# Patient Record
Sex: Male | Born: 1980 | Hispanic: Yes | Marital: Single | Smoking: Current some day smoker
Health system: Southern US, Community
[De-identification: ages and names within clinical notes are randomized; demographics above are authoritative.]

## PROBLEM LIST (undated history)

## (undated) DIAGNOSIS — I10 Essential (primary) hypertension: Secondary | ICD-10-CM

## (undated) DIAGNOSIS — E669 Obesity, unspecified: Secondary | ICD-10-CM

## (undated) HISTORY — PX: KNEE SURGERY: SHX244

## (undated) HISTORY — PX: APPENDECTOMY: SHX54

## (undated) HISTORY — PX: ANKLE SURGERY: SHX546

## (undated) HISTORY — PX: NOSE SURGERY: SHX723

---

## 2008-01-06 ENCOUNTER — Inpatient Hospital Stay (HOSPITAL_COMMUNITY): Admission: EM | Admit: 2008-01-06 | Discharge: 2008-01-08 | Payer: Self-pay | Admitting: Emergency Medicine

## 2009-06-06 ENCOUNTER — Inpatient Hospital Stay (HOSPITAL_COMMUNITY): Admission: EM | Admit: 2009-06-06 | Discharge: 2009-06-07 | Payer: Self-pay | Admitting: Emergency Medicine

## 2009-06-06 ENCOUNTER — Encounter (INDEPENDENT_AMBULATORY_CARE_PROVIDER_SITE_OTHER): Payer: Self-pay | Admitting: Surgery

## 2010-10-27 ENCOUNTER — Emergency Department (HOSPITAL_COMMUNITY)
Admission: EM | Admit: 2010-10-27 | Discharge: 2010-10-28 | Payer: Self-pay | Source: Home / Self Care | Admitting: Emergency Medicine

## 2011-02-15 LAB — COMPREHENSIVE METABOLIC PANEL
AST: 19 U/L (ref 0–37)
Albumin: 4.1 g/dL (ref 3.5–5.2)
Alkaline Phosphatase: 136 U/L — ABNORMAL HIGH (ref 39–117)
CO2: 25 mEq/L (ref 19–32)
Chloride: 106 mEq/L (ref 96–112)
GFR calc Af Amer: >60 mL/min (ref >60)
GFR calc non Af Amer: >60 mL/min (ref >60)
Total Bilirubin: 0.7 mg/dL (ref 0.3–1.2)

## 2011-02-15 LAB — URINALYSIS, ROUTINE W REFLEX MICROSCOPIC
Bilirubin Urine: NEGATIVE
Hgb urine dipstick: NEGATIVE
Nitrite: NEGATIVE
Specific Gravity, Urine: 1.026 (ref 1.005–1.030)
pH: 6.5 (ref 5.0–8.0)

## 2011-02-15 LAB — CBC
HCT: 43.7 % (ref 39.0–52.0)
MCHC: 33.2 g/dL (ref 30.0–36.0)
MCV: 90.1 fL (ref 78.0–100.0)
Platelets: 364 10*3/uL (ref 150–400)
RDW: 12.2 % (ref 11.5–15.5)
WBC: 21 10*3/uL — ABNORMAL HIGH (ref 4.0–10.5)

## 2011-02-15 LAB — DIFFERENTIAL
Basophils Absolute: 0.2 10*3/uL — ABNORMAL HIGH (ref 0.0–0.1)
Basophils Relative: 1 % (ref 0–1)
Monocytes Relative: 4 % (ref 3–12)

## 2011-03-24 NOTE — H&P (Signed)
NAME:  [name], [name] NO.:  [id number]   MEDICAL RECORD NO.:  [id number]          PATIENT TYPE:  EMS   LOCATION:  ED                           FACILITY:  [facility]   PHYSICIAN:  Velora Heckler, MD      DATE OF BIRTH:  [birth year]   DATE OF ADMISSION:  06/06/2009  DATE OF DISCHARGE:                              HISTORY & PHYSICAL   REFERRING PHYSICIAN:  Dr. Linwood Dibbles, Emergency Department, [facility].   CHIEF COMPLAINT:  Abdominal pain, acute appendicitis.   HISTORY OF PRESENT ILLNESS:  The patient is a 30 year old Hispanic male  who presents to the emergency department with less than 24-hour history  of abdominal pain radiating to the right lower quadrant.  The patient  was seen and evaluated in the emergency room.  White blood cell count  was elevated at 21,000.  CT scan abdomen and pelvis was obtained, and  demonstrated findings consistent with acute appendicitis with  appendicolith.  General surgery is called for management.   PAST MEDICAL HISTORY:  Unremarkable.   MEDICATIONS:  None.   ALLERGIES:  No known drug allergies.   SOCIAL HISTORY:  The patient is married.  He is accompanied by his wife  who acts as a  Nurse, learning disability.  The patient speaks limited [place].  They  have 7 children.  He does smoke a pack of cigarettes a day.  He drinks  alcohol frequently.  He denies illicit drug use.   FAMILY HISTORY:  Noncontributory.   A 15-system review discussed with the patient's wife and the patient  with no other significant findings except as noted above.   PHYSICAL EXAMINATION:  A 30 year old well-developed, well-nourished  Hispanic male in no acute distress.  Temp 98.1, pulse 69, respirations 18, blood pressure 150/111.  HEENT:  Shows him to be normocephalic, atraumatic.  Sclerae are clear.  Conjunctiva are clear.  Dentition fair.  Mucous membranes dry.  Palpation of the neck shows no lymphadenopathy.  No thyroid nodularity.  No mass.  No  tenderness.  LUNGS:  Clear to auscultation bilaterally without rales, rhonchi or  wheeze.  CARDIAC:  Shows regular rate and rhythm without murmur.  Peripheral pulses are full.  EXTREMITIES:  Nontender without edema.  ABDOMEN:  Soft.  Bowel sounds are present.  There is mild to moderate  tenderness in the right lower quadrant.  There is voluntary guarding.  There is no palpable mass.  There is rebound tenderness.  EXTREMITIES:  Nontender without edema.  NEUROLOGICAL:  The patient is alert and oriented without focal deficit.   DATA REVIEWED:  Laboratory studies show white count 21.0, hemoglobin  14.5, platelet count 364,000, differential shows 90% segmented  neutrophils.  Electrolytes are normal.  Liver function tests are normal.  Urinalysis is benign.   RADIOGRAPHIC STUDIES:  CT scan abdomen and pelvis demonstrating an  appendicolith at the base of the appendix with a swollen dilated  appendix measuring 12 mm and surrounding inflammatory changes consistent  with acute appendicitis.   IMPRESSION:  Acute appendicitis.   PLAN:  The patient will be admitted on  the general surgical service.  Intravenous antibiotics will be initiated.  The patient will be prepared  and taken to the operating room for appendectomy.   The operative procedure was discussed with the patient using his wife as  the interpreter.  He understands the proposed procedure.  He understands  the possibility of conversion to open surgery.  He wishes to proceed.      Velora Heckler, MD  Electronically Signed     TMG/MEDQ  D:  06/06/2009  T:  06/07/2009  Job:  [id number]   cc:   Celene Kras, MD  Fax: [phone]

## 2011-03-24 NOTE — Op Note (Signed)
[name], [name]               ACCOUNT NO.:  [id number]   MEDICAL RECORD NO.:  [id number]          PATIENT TYPE:  INP   LOCATION:  5011                         FACILITY:  [facility]   PHYSICIAN:  Zola Button T. Lazarus Salines, M.D. DATE OF BIRTH:  05/16/1981   DATE OF PROCEDURE:  01/06/2008  DATE OF DISCHARGE:                               OPERATIVE REPORT   PREOPERATIVE DIAGNOSES:  1. Nasal and nasoseptal fracture.  2. Lacerations of the upper lip, of the oral vestibule, and of the      nasal vestibule on the left side.   POSTOPERATIVE DIAGNOSES:  1. Nasal and nasoseptal fracture.  2. Degloving laceration of the oral vestibule and of the nasal      vestibule bilaterally.  3. Comminuted nasal fracture with depression.  4. Leftward septal deviation and fracture with laceration along the      maxillary crest on the left side.  5. Partial-thickness laceration in the midline of the vermilion of the      lip.  6. Laceration extending out to the philtrum and up the columella of      the nose on the left side.   SURGEON:  Gloris Manchester. Lazarus Salines, M.D.   PROCEDURE:  With the patient in the comfortable supine position, general  orotracheal anesthesia was induced without difficulty.  At an  appropriate level, the table was turned 90 degrees and the patient  placed in a slight sitting position.  Dr. Luiz Blare, Orthopedics, and Dr.  Lazarus Salines, ENT, worked simultaneously in their respective areas.   The pharynx was suctioned clean and a saline-moistened throat pack was  placed.  The nose was suctioned from old clots and Afrin solution on 1/2  x 3 inches cottonoids was placed into the nasal chamber on both sides.  Further inspection revealed significant degloving injuries along the  face of the maxilla on both sides and down below the nasal spine into  the upper lip and oral vestibule through and through, additional  laceration of the vermilion of the lower lip and circumferentially  around the left nasal  vestibule and down onto the philtrum of the upper  lip.  The wounds were carefully irrigated and suctioned clear.   Beginning with the upper lip, the midline laceration was closed with  deep and then superficial 4-0 chromic sutures.   Working internally, the mucosa of the gingiva was reapproximated to  close the oral vestibular wound with 3-0 and 4-0 chromic sutures.   Working through the upper lip laceration, deep soft tissue  reapproximation was performed with interrupted 4-0 chromic sutures and  finally the skin was closed in a cosmetic fashion with interrupted 5-0  Ethilon sutures up onto the columella.   The cottonoids were removed from the left side of the nose.  Working  through the left nasal vestibule, a small amount of cautery was used for  hemostasis along the mucosal edges.  The mucosa was lacerated up into  the dome of the nose and then more than 270 degrees around the nasal  vestibule and then up onto the columella from below.  A 4-0  chromic was  used to reapproximate the mucosa overlying the lower lateral cartilages  in the nasal dome and this was carried around clockwise.  A 3-0 chromic  was used to reapproximate the heavier mucosa of the anterior pole of the  inferior turbinate to the mucosa of the nasal vestibule.  A good  reconstruction was accomplished.  Hemostasis was observed.   Working in the right nasal vestibule, a lesser degloving injury was  closed once again with 3-0 chromic suture.   At this point, all cottonoids had been removed from the nose.  The nose  was carefully suctioned clear.  A butter knife fracture elevator was  used to raise the dorsum of the nose, which was comminuted, and easily  reduced into the midline.  An Ash forceps was used to mobilize the nasal  septum into the midline.  Hemostasis was observed.   Merocel packs were shortened to approximately 8-cm length and were  placed in both sides of the nose inferiorly to support the septum.   These were expanded and moistened with Ciprodex otic solution.  Again,  hemostasis was observed.   The external nose was cleaned with alcohol and painted with skin prep.  Steri-Strips were applied in the standard fashion.  A small/medium  Denver splint was fashioned and then applied to the nose and compressed  slightly for support.  Again, hemostasis was observed.  This completed  the procedure.   The pharynx was carefully suctioned clear and the throat pack was  removed.  The strings from the Merocel packs were secured to the  external nasal splint.  A drip pad was applied in the standard fashion.  A small amount of bacitracin ointment was applied into the nostrils and  along the wounds of the lip.  At this point, the patient was returned to  Anesthesia, awakened, extubated, and transferred to Recovery in stable  condition.   COMMENT:  [age]-year-old Hispanic male fell off a ladder earlier  today, sustaining a left patellar tendon injury which was repaired by  Dr. Luiz Blare, and complex degloving lacerations of the nose and upper lip  as well as nasal and nasoseptal fractures.  I anticipate a routine  postoperative recovery with attention to ice, elevation, analgesia,  antibiosis, oral and nasal, and wound hygiene measures.  The patient  could be done as an outpatient for my standpoint.  I would like him to  stay on Keflex for least 1 week.  I would like to see him back in my  office in 5 days for removal of the internal Merocel packs and 10 days  for removal of the external [place] splint.      Gloris Manchester. Lazarus Salines, M.D.  Electronically Signed     KTW/MEDQ  D:  01/06/2008  T:  01/08/2008  Job:  [id number]   cc:   Harvie Junior, M.D.

## 2011-03-24 NOTE — Op Note (Signed)
[name], [name]               ACCOUNT NO.:  [id number]   MEDICAL RECORD NO.:  [id number]          PATIENT TYPE:  INP   LOCATION:  1826                         FACILITY:  [facility]   PHYSICIAN:  Harvie Junior, M.D.   DATE OF BIRTH:  05/16/1981   DATE OF PROCEDURE:  01/06/2008  DATE OF DISCHARGE:                               OPERATIVE REPORT   PREOPERATIVE DIAGNOSES:  1. Fractured patella with small inferior pole fragment.  2. Fourth metatarsophalangeal dislocation of the foot.   POSTOPERATIVE DIAGNOSES:  1. Fractured patella with small inferior pole fragment.  2. Fourth metatarsophalangeal dislocation of the foot.   PRINCIPAL PROCEDURE:  1. Patellar tendon repair with excision of inferior pole fragment.  2. Arthrotomy with irrigation and debridement thorough of the joint.  3. Closed reduction of metatarsophalangeal joint fourth, left.   SURGEON:  Harvie Junior, M.D.   ASSISTANT:  Marshia Ly, P.A.   ANESTHESIA:  General.   HISTORY:  [name] is a 30 year old male with an unfortunate history  of being up on a ladder and having the ladder kind of slip away from  him.  He suffered severe injuries to his face and had the patellar  fracture left and the toe dislocation.  He also had bumped his elbow and  he was complaining of these things.  Ultimately we were consulted in the  emergency room and through an interpreter we discussed the situation  with him and his friends who spoke actually both [place] and [place].  We got a fluent Spanish interpreter through the phone system.  We talked  to him and he seemed to understand very clearly what the problems were.  Ultimately he was taken to the operating room for fixation of these  problems.   PROCEDURE IN DETAIL:  The patient was taken to the operating room.  Once  adequate anesthesia was obtained with general anesthetic the patient was  placed on the operating table.  The left leg was prepped and draped in  the usual  sterile fashion.  Following this the left leg was  exsanguinated.  The pressure was set at [facility].  Following this a  cephalad incision was made transversely over the extensor mechanism and  the obvious fracture and patellar tendon rupture was identified.  The  fracture fragments were then excised piecemeal and once they were all  removed from the joint which was then thoroughly irrigated and loose  fragments of patellar bone were removed from the joint.  It was  copiously irrigated with a liter of pulsatile irrigation.  At this point  a #5 FiberWire centrally was sewn in a Halesite fashion, #2 FiberWire  medially and laterally yielding 3 sutures through the patellar tendon  were passed and then 4 drill holes were made through the patella and  these FiberWire sutures were passed.  The 2 lateral and medial sutures  were held in a reduced position and a central #5 FiberWire was tied and  then the 2 lateral and medial FiberWires were tied.  Medial and lateral  retinaculum were then repaired with 1 Vicryl running  and this gave an  excellent __________ at 45 degrees, was reamed up to about 65-70 without  any tension on the repair.  Once that was completed the bursal and  subcuticular levels were closed and we got actually pretty nice bursal  layer closure over the FiberWire and then the skin was closed with 0 and  2-0 Vicryl and skin with skin staples.  Sterile compressive dressing was  applied.   Attention was then turned to the foot where the metatarsophalangeal  dislocation of the fourth toe was identified and this was popped back  into place with very stable fat pad in place.  The patient was still  undergoing facial surgery and will ultimately be taken to the recovery  room when that is completed.      Harvie Junior, M.D.  Electronically Signed     JLG/MEDQ  D:  01/06/2008  T:  01/08/2008  Job:  [id number]   cc:   Zola Button T. Lazarus Salines, M.D.  Marshia Ly, P.A.

## 2011-03-24 NOTE — Op Note (Signed)
[name], [name]               ACCOUNT NO.:  [id number]   MEDICAL RECORD NO.:  [id number]          PATIENT TYPE:  INP   LOCATION:  0098                         FACILITY:  [facility]   PHYSICIAN:  Velora Heckler, MD      DATE OF BIRTH:  21-Oct-1981   DATE OF PROCEDURE:  06/07/2009  DATE OF DISCHARGE:                               OPERATIVE REPORT   PREOPERATIVE DIAGNOSIS:  Acute appendicitis.   POSTOPERATIVE DIAGNOSIS:  Acute appendicitis.   PROCEDURE:  Laparoscopic appendectomy.   SURGEON:  Velora Heckler, MD, FACS   ANESTHESIA:  General per Quentin Cornwall. Council Mechanic, M.D.   ESTIMATED BLOOD LOSS:  Minimal.   PREPARATION:  ChloraPrep.   COMPLICATIONS:  None.   INDICATIONS:  The patient is a 30 year old Hispanic male who presents to  the emergency department with less than 24 hour history of abdominal  pain localized to the right lower quadrant.  Laboratory studies showed  elevated white blood cell count of 21,000 with a left shift.  CT scan  abdomen and pelvis confirms a diagnosis of acute appendicitis.  The  patient is prepared brought to the operating room.   BODY OF REPORT:  Procedure is done in OR #1 at [facility].  The patient is brought to the operating room, placed in  supine position on the operating room table.  Following administration  of general anesthesia, the patient is prepped and draped in usual strict  aseptic fashion.  After ascertaining that an adequate level of  anesthesia been achieved, a supraumbilical incision was made a #15  blade.  Dissection was carried through subcutaneous tissues.  Fascia is  incised in the midline and the peritoneal cavity is entered cautiously.  A 0-0 Vicryl pursestring suture was placed in the fascia.  A Hasson  cannula is introduced under direct vision and secured with a pursestring  suture.  Abdomen was insufflated carbon dioxide.  Laparoscope was  introduced and the abdomen explored.  Operative ports were placed in  the  right upper quadrant and left lower quadrant.  Omentum is mobilized out  of the right lower quadrant.  There is a tense, distended appendix  consistent with acute appendicitis.  There is no sign of perforation.  There is a small amount of cloudy fluid immediately adjacent to the  appendix.  Appendix was elevated and using the harmonic scalpel, the  mesoappendix was divided.  Dissection was carried down to the base of  the appendix.  Base of the appendix is cleared circumferentially.  The  lateral cecal attachments to the peritoneum are divided allowing for  mobilization.  The base of the appendix was then transected at its  junction with the cecal wall using an Endo-GIA stapler.  Appendix was  placed into an EndoCatch bag and removed through the umbilical port.  Staple line shows good hemostasis.  Pneumoperitoneum was released and  ports were removed.  0-0 Vicryl pursestring suture is tied securely.  Port sites were anesthetized with local anesthetic.  All three wounds  were closed  with interrupted 4-0 Monocryl subcuticular sutures.  Wounds  washed and  dried and Benzoin and Steri-Strips are applied.  Sterile dressings were  applied.  The patient is awakened from anesthesia and brought to the  recovery room in stable condition.  The patient tolerated the procedure  well.      Velora Heckler, MD  Electronically Signed     TMG/MEDQ  D:  06/07/2009  T:  06/07/2009  Job:  [id number]   cc:   Celene Kras, MD  Fax: [phone]

## 2011-03-24 NOTE — Consult Note (Signed)
NAMEZACHARIE, PORTNER               ACCOUNT NO.:  [id number]   MEDICAL RECORD NO.:  [id number]          PATIENT TYPE:  INP   LOCATION:  5011                         FACILITY:  [facility]   PHYSICIAN:  Zola Button T. Lazarus Salines, M.D. DATE OF BIRTH:  05/16/1981   DATE OF CONSULTATION:  01/06/2008  DATE OF DISCHARGE:                                 CONSULTATION   CHIEF COMPLAINT:  Facial trauma.   HISTORY:  A. 30 year old Hispanic male reportedly fell off a ladder  earlier this afternoon from a height of approximately 20 feet head to  ground.  It is not clear exactly how he landed, but suspicion of landing  either on the rungs of the latter or on his face.  He did not lose  consciousness.  He had bleeding from the nose which has stopped.  Evaluation in the emergency room revealed a comminuted nasal fracture,  displaced nasal septal fracture, question of some telescoping of the  nasal ethmoid complex.  ENT was called in consultation for assistance  with this.  He also sustained a patellar tendon laceration/avulsion  which Dr. Luiz Blare is going to tend to.   PHYSICAL EXAMINATION:  This is a somewhat stocky and overweight young  adult Hispanic male.  He seems to respond appropriately through an  interpreter.  He has swelling of the midface, blood filling both  nostrils and partial swelling of the left upper eyelid.  He seems to  have subjectively intact vision in each eye with full range of motion.  The ears are clear with aerated drums.  I could not examine the internal  nose on account of blood.  There was some depression at the glabella,  which his friend thinks may be old.  There is some flattening of the  dorsum and definitely some edema consistent with the injury.  There are  some lacerations in the left nasal vestibule and, on inspection, on the  lip and up into the upper oral vestibule.  Dental status seems basically  intact.  There is a small amount of old blood in the pharynx.  Neck  unremarkable.   IMPRESSION:  Depressed nasal fracture.  Displaced nasal septal fracture.  Left nasal vestibular lacerations, lip lacerations, and a degloving  laceration of the upper oral vestibule.   PLAN:  I would like to join Dr. Luiz Blare in the OR this evening, to  thoroughly clean out his nose, irrigate and clean all of his wounds,  close the various lacerations and perform closed reduction of the nasal  septum and nasal dorsum.  I discussed this with the patient through the  interpreter.  We will place pack in his nose which we should remove in  five days in my office.  We will have an external nasal splint which  should remain in place for 10 days.  He should be on cephalexin or  similar antibiotics for 10 days.      Gloris Manchester. Lazarus Salines, M.D.  Electronically Signed     KTW/MEDQ  D:  01/06/2008  T:  01/08/2008  Job:  [id number]

## 2011-03-27 NOTE — Discharge Summary (Signed)
NAMESHYLER, [name]               ACCOUNT NO.:  [id number]   MEDICAL RECORD NO.:  [id number]          PATIENT TYPE:  INP   LOCATION:  5011                         FACILITY:  [facility]   PHYSICIAN:  Harvie Junior, M.D.   DATE OF BIRTH:  05/16/1981   DATE OF ADMISSION:  01/06/2008  DATE OF DISCHARGE:  01/08/2008                               DISCHARGE SUMMARY   ADMITTING DIAGNOSES:  1. Avulsion fracture left inferior pole of patella with patellar      tendon rupture.  2. Dislocation left fourth toe.  3. Depressed nasal fracture with displaced nasal septal fracture and      left nasal vestibular lacerations with degloving laceration to the      upper oral vestibule.   DISCHARGE DIAGNOSES:  1. Avulsion fracture left inferior pole of patella with patellar      tendon rupture.  2. Dislocation left fourth toe.  3. Depressed nasal fracture with displaced nasal septal fracture and      left nasal vestibular lacerations with degloving laceration to the      upper oral vestibule.   PROCEDURES IN HOSPITAL:  1. Repair of left patellar tendon with excision of patellar bone      fragments, Dr. Jodi Geralds.  2. Closed reduction of the left fourth toe, Dr. Jodi Geralds  3. Closure of the lip and nose degloving lacerations.  4. Closed reduction with stabilization of nasal and nasal septal      lacerations, Dr. Lazarus Salines.   BRIEF HISTORY:  This is a 30 year old male who fell off a ladder early  on the day of admission.  He could not lift his left leg.  He also had a  significant facial injury which was treated by Dr. Lazarus Salines, ear, nose,  and throat specialist.  The patient has no history of previous injury to  his leg.  He was at work when this occurred.  He is admitted for  treatment of his left knee.  He had an inferior total avulsion fracture  with rupture of his patella tendon.  He also had multiple facial  lacerations secondary to his facial trauma, and he was treated by Dr.  Lazarus Salines.   PERTINENT LABORATORY STUDIES:  EKG on admission showed sinus rhythm with  marked sinus arrhythmia and has normal EKG.  X-rays of the chest showed  no active lung disease.  X-rays of the left knee showed an avulsion  fracture of the inferior pole of the patella with the patella retracted  cephalad.  Left foot x-rays showed recurrent dislocation of the left  foot MTP joint.  Left elbow x-rays were negative.  CT scan of the head  showed no acute intracranial abnormality with multiple facial fractures.  A comminuted nasal bone fracture with a fracture of the nasal septum  which was displaced posteriorly and there were some fractures of the  ethmoid sinuses.  There was no orbital fracture.  CT scan of the  cervical spine showed no evidence of cervical spine fracture or  subluxation.  Hemoglobin on admission was 15.1, hematocrit 44.0, and WBC  was 25.0.  indices were within normal limits.  CMET was normal and  slightly elevated ALP of 131.   HOSPITAL COURSE:  The patient was admitted to the emergency room and  evaluated by Dr. Luiz Blare for his orthopedic injuries and brought back to  Dr. Lazarus Salines for his ENT injuries.  He was brought to the operating room  where he underwent surgery as described.  Preoperatively, he was given a  gram of Ancef, and postoperatively, he was placed in a knee immobilizer  to his left knee.  Dressings were placed by Dr. Lazarus Salines for his nasal  injuries and lacerations.  On postop day 1, he had complaints of some  knee pain.  He was taking fluids.  He had gotten out of bed.  He had  vital signs that were stable, and he is afebrile.  Knee immobilizers  intact to the left knee, area is intact distally, as well fourth toe in  good position.  He was ordered physical therapy for crutch ambulation,  weightbearing as tolerated on the left.  He was given Percocet for pain  and also given Ancef 1 g IV q.8 h. x24 hours.  On postop day 2, the  patient was ambulating with physical  therapy and doing well.  He was  slow to mobilize, but overall felt good.  His vital signs were stable.  He is afebrile.  Left lower extremity was supple, and his calf was soft.  He has gotten out of bed with physical therapy.  Again, he was sent home  after physical therapy.   CONDITION ON DISCHARGE:  Improved.   DIET ON DISCHARGE:  Regular.   ACTIVITY STATUS:  Will ambulate and weightbearing as tolerated with  crutches on the left with a knee immobilizer.   He was given RX for Percocet 5 mg p.r.n. pain and Keflex 500 mg t.i.d.  x5 days.  He will follow up with Dr. Luiz Blare in 2 weeks in the office,  with Dr. Lazarus Salines in 5 days.      Marshia Ly, P.A.      Harvie Junior, M.D.  Electronically Signed    JB/MEDQ  D:  06/20/2008  T:  06/21/2008  Job:  [id number]   cc:   Harvie Junior, M.D.  Gloris [place]. Lazarus Salines, M.D.

## 2011-05-14 ENCOUNTER — Emergency Department (HOSPITAL_COMMUNITY)
Admission: EM | Admit: 2011-05-14 | Discharge: 2011-05-15 | Disposition: A | Discharge status: Home / Self Care | Payer: Self-pay | Attending: Emergency Medicine | Admitting: Emergency Medicine

## 2011-05-14 DIAGNOSIS — L0231 Cutaneous abscess of buttock: Secondary | ICD-10-CM | POA: Insufficient documentation

## 2011-05-14 DIAGNOSIS — L03317 Cellulitis of buttock: Secondary | ICD-10-CM | POA: Insufficient documentation

## 2011-05-17 ENCOUNTER — Emergency Department (HOSPITAL_COMMUNITY)
Admission: EM | Admit: 2011-05-17 | Discharge: 2011-05-17 | Disposition: A | Discharge status: Home / Self Care | Payer: Self-pay | Attending: Emergency Medicine | Admitting: Emergency Medicine

## 2011-05-17 DIAGNOSIS — L0231 Cutaneous abscess of buttock: Secondary | ICD-10-CM | POA: Insufficient documentation

## 2011-05-17 DIAGNOSIS — Z4801 Encounter for change or removal of surgical wound dressing: Secondary | ICD-10-CM | POA: Insufficient documentation

## 2011-05-17 DIAGNOSIS — L03317 Cellulitis of buttock: Secondary | ICD-10-CM | POA: Insufficient documentation

## 2011-05-29 ENCOUNTER — Emergency Department (HOSPITAL_COMMUNITY)
Admission: EM | Admit: 2011-05-29 | Discharge: 2011-05-30 | Disposition: A | Discharge status: Home / Self Care | Payer: Self-pay | Attending: Emergency Medicine | Admitting: Emergency Medicine

## 2011-05-29 DIAGNOSIS — L500 Allergic urticaria: Secondary | ICD-10-CM | POA: Insufficient documentation

## 2011-05-29 DIAGNOSIS — L2989 Other pruritus: Secondary | ICD-10-CM | POA: Insufficient documentation

## 2011-05-29 DIAGNOSIS — R21 Rash and other nonspecific skin eruption: Secondary | ICD-10-CM | POA: Insufficient documentation

## 2011-05-29 DIAGNOSIS — R229 Localized swelling, mass and lump, unspecified: Secondary | ICD-10-CM | POA: Insufficient documentation

## 2011-05-29 DIAGNOSIS — L298 Other pruritus: Secondary | ICD-10-CM | POA: Insufficient documentation

## 2011-07-31 LAB — DIFFERENTIAL
Basophils Absolute: 0.1
Basophils Relative: 0
Eosinophils Absolute: 0
Lymphocytes Relative: 8 — ABNORMAL LOW
Monocytes Relative: 5

## 2011-07-31 LAB — PROTIME-INR: Prothrombin Time: 14

## 2011-07-31 LAB — CBC
Platelets: 447 — ABNORMAL HIGH
RBC: 5.04
RDW: 12.7

## 2011-07-31 LAB — COMPREHENSIVE METABOLIC PANEL
ALT: 39
Albumin: 4.1
BUN: 13
CO2: 25
Calcium: 8.9
GFR calc non Af Amer: >60
Glucose, Bld: 101 — ABNORMAL HIGH
Sodium: 136
Total Bilirubin: 0.6

## 2011-10-03 ENCOUNTER — Emergency Department (HOSPITAL_COMMUNITY)
Admission: EM | Admit: 2011-10-03 | Discharge: 2011-10-03 | Disposition: A | Discharge status: Home / Self Care | Payer: Self-pay | Attending: Emergency Medicine | Admitting: Emergency Medicine

## 2011-10-03 ENCOUNTER — Encounter: Payer: Self-pay | Admitting: *Deleted

## 2011-10-03 DIAGNOSIS — H6690 Otitis media, unspecified, unspecified ear: Secondary | ICD-10-CM | POA: Insufficient documentation

## 2011-10-03 DIAGNOSIS — H9209 Otalgia, unspecified ear: Secondary | ICD-10-CM | POA: Insufficient documentation

## 2011-10-03 DIAGNOSIS — J029 Acute pharyngitis, unspecified: Secondary | ICD-10-CM | POA: Insufficient documentation

## 2011-10-03 MED ORDER — ANTIPYRINE-BENZOCAINE 5.4-1.4 % OT SOLN: 3.0000 [drp] | OTIC | Status: AC | PRN

## 2011-10-03 MED ORDER — ACETAMINOPHEN 325 MG PO TABS
650.0000 mg | ORAL_TABLET | Freq: Once | ORAL | Status: AC
  Administered 2011-10-03: 650 mg via ORAL
  Filled 2011-10-03: qty 2

## 2011-10-03 MED ORDER — PENICILLIN V POTASSIUM 500 MG PO TABS: 500.0000 mg | ORAL_TABLET | Freq: Three times a day (TID) | ORAL | Status: AC

## 2011-10-03 NOTE — ED Provider Notes (Signed)
History     CSN: [id number] Arrival date & time: 10/03/2011  1:03 PM   First MD Initiated Contact with Patient 10/03/11 1511      Chief Complaint  Patient presents with  . Otalgia    (Consider location/radiation/quality/duration/timing/severity/associated sxs/prior treatment) Patient is a 30 y.o. male presenting with ear pain. The history is provided by the patient.  Otalgia The current episode started more than 2 days ago. There is pain in the right ear. The problem occurs constantly. The problem has been gradually worsening. There has been no fever. The pain is at a severity of 5/10. The pain is moderate. Associated symptoms include sore throat.    History reviewed. No pertinent past medical history.  Past Surgical History  Procedure Date  . Knee surgery     No family history on file.  History  Substance Use Topics  . Smoking status: Current Some Day Smoker  . Smokeless tobacco: Not on file  . Alcohol Use: No      Review of Systems  HENT: Positive for ear pain and sore throat.   All other systems reviewed and are negative.    Allergies  Review of patient's allergies indicates no known allergies.  Home Medications   Current Outpatient Rx  Name Route Sig Dispense Refill  . SUDAFED PE NIGHTTIME COLD PO Oral Take 2 tablets by mouth daily as needed. For congestion.       BP 159/98  Pulse 91  Temp(Src) 97.7 F (36.5 C) (Oral)  Resp 18  SpO2 99%  Physical Exam  Constitutional: He is oriented to person, place, and time. He appears well-developed and well-nourished.  HENT:  Head: Normocephalic and atraumatic.  Left Ear: External ear normal.  Ears:  Eyes: EOM are normal. Pupils are equal, round, and reactive to light.  Neck: Normal range of motion.  Cardiovascular: Normal rate and regular rhythm.   Pulmonary/Chest: Effort normal and breath sounds normal.  Musculoskeletal: Normal range of motion.  Neurological: He is alert and oriented to person, place,  and time.  Skin: Skin is warm and dry.    ED Course  Procedures (including critical care time)  Labs Reviewed - No data to display No results found.   No diagnosis found.    MDM          Dorthula Matas, PA 10/03/11 [phone]

## 2011-10-03 NOTE — ED Notes (Signed)
bil ear pain, started 3 days ago

## 2011-10-04 NOTE — ED Provider Notes (Signed)
Medical screening examination/treatment/procedure(s) were performed by non-physician practitioner and as supervising physician I was immediately available for consultation/collaboration.   Joya Gaskins, MD 10/04/11 0001

## 2012-05-16 ENCOUNTER — Emergency Department (HOSPITAL_COMMUNITY)
Admission: EM | Admit: 2012-05-16 | Discharge: 2012-05-17 | Disposition: A | Discharge status: Home / Self Care | Payer: Self-pay | Attending: Emergency Medicine | Admitting: Emergency Medicine

## 2012-05-16 ENCOUNTER — Encounter (HOSPITAL_COMMUNITY): Payer: Self-pay | Admitting: *Deleted

## 2012-05-16 DIAGNOSIS — M79609 Pain in unspecified limb: Secondary | ICD-10-CM | POA: Insufficient documentation

## 2012-05-16 DIAGNOSIS — M545 Low back pain, unspecified: Secondary | ICD-10-CM | POA: Insufficient documentation

## 2012-05-16 DIAGNOSIS — F17200 Nicotine dependence, unspecified, uncomplicated: Secondary | ICD-10-CM | POA: Insufficient documentation

## 2012-05-16 DIAGNOSIS — M25559 Pain in unspecified hip: Secondary | ICD-10-CM | POA: Insufficient documentation

## 2012-05-16 DIAGNOSIS — M549 Dorsalgia, unspecified: Secondary | ICD-10-CM

## 2012-05-16 NOTE — ED Notes (Signed)
Pt c/o pain left flank/hip pain radiating down left leg when he raises his left arm; no known injury

## 2012-05-17 MED ORDER — METHOCARBAMOL 500 MG PO TABS: 1000.0000 mg | ORAL_TABLET | Freq: Four times a day (QID) | ORAL | Status: AC

## 2012-05-17 MED ORDER — HYDROCODONE-ACETAMINOPHEN 5-325 MG PO TABS: ORAL_TABLET | ORAL | Status: AC

## 2012-05-17 MED ORDER — OXYCODONE-ACETAMINOPHEN 5-325 MG PO TABS
1.0000 | ORAL_TABLET | Freq: Once | ORAL | Status: AC
  Administered 2012-05-17: 1 via ORAL
  Filled 2012-05-17: qty 1

## 2012-05-17 MED ORDER — NAPROXEN 500 MG PO TABS: 500.0000 mg | ORAL_TABLET | Freq: Two times a day (BID) | ORAL | Status: DC

## 2012-05-17 NOTE — ED Notes (Signed)
Rx given x3 Pt ambulating independently w/ steady gait on d/c in no acute distress, A&Ox4. D/c instructions reviewed w/ pt and family - pt and family deny any further questions or concerns at present.  

## 2012-05-17 NOTE — ED Provider Notes (Signed)
History     CSN: [id number]  Arrival date & time 05/16/12  2108   First MD Initiated Contact with Patient 05/16/12 2321      Chief Complaint  Patient presents with  . Hip Pain  . Leg Pain    (Consider location/radiation/quality/duration/timing/severity/associated sxs/prior treatment) HPI Comments: Patient presents with complaint of left lower back pain with a shooting pain into his left leg that began acutely this morning when he was standing up from his knees. 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 Walking and standing makes the pain worse. Sitting and resting makes the pain better. Onset was acute. Course is constant. Patient denies red flag signs and symptoms of lower back pain. He denies numbness, weakness, or tingling in his legs.  Patient is a 31 y.o. male presenting with back pain. The history is provided by the patient. A language interpreter was used.  Back Pain  This is a new problem. The current episode started 12 to 24 hours ago. The problem occurs constantly. The problem has not changed since onset.The pain is associated with twisting. The pain is present in the lumbar spine. The quality of the pain is described as shooting. The pain radiates to the left thigh and left knee. The pain is moderate. The symptoms are aggravated by bending and twisting (walking). Associated symptoms include leg pain. Pertinent negatives include no fever, no numbness, no weight loss, no bowel incontinence, no perianal numbness, no bladder incontinence, no paresthesias and no weakness. He has tried nothing for the symptoms. Risk factors include obesity.    History reviewed. No pertinent past medical history.  Past Surgical History  Procedure Date  . Knee surgery     No family history on file.  History  Substance Use Topics  . Smoking status: Current Some Day Smoker  . Smokeless tobacco: Not on file  . Alcohol Use:  No      Review of Systems  Constitutional: Negative for fever, weight loss and unexpected weight change.  Gastrointestinal: Negative for constipation and bowel incontinence.       Neg for fecal incontinence  Genitourinary: Negative for bladder incontinence, hematuria, flank pain and difficulty urinating.       Negative for urinary incontinence or retention  Musculoskeletal: Positive for back pain.  Neurological: Negative for weakness, numbness and paresthesias.       Negative for saddle paresthesias     Allergies  Review of patient's allergies indicates no known allergies.  Home Medications   Current Outpatient Rx  Name Route Sig Dispense Refill  . SUDAFED PE NIGHTTIME COLD PO Oral Take 2 tablets by mouth daily as needed. For congestion.       BP 141/83  Pulse 99  Temp 98.7 F (37.1 C) (Oral)  Resp 18  Ht 5\' 9"  (1.753 m)  Wt 294 lb (133.358 kg)  BMI 43.42 kg/m2  SpO2 100%  Physical Exam  Nursing note and vitals reviewed. Constitutional: He appears well-developed and well-nourished.  HENT:  Head: Normocephalic and atraumatic.  Eyes: Conjunctivae are normal.  Neck: Normal range of motion.  Abdominal: Soft. There is no tenderness. There is no CVA tenderness.  Musculoskeletal: Normal range of motion. He exhibits no tenderness.       There is no tenderness to palpation over cervical/thoracic/lumbar/sacral spine. Tenderness to palpation over L lumbar paraspinal muscles. No step-off noted with palpation of spine.  Neurological: He is alert. He has normal reflexes. No sensory deficit. He exhibits normal muscle tone.       5/5 strength in entire lower extremities bilaterally. No sensation deficit.   Skin: Skin is warm and dry.  Psychiatric: He has a normal mood and affect.    ED Course  Procedures (including critical care time)  Labs Reviewed - No data to display No results found.   1. Back pain     12:13 AM Patient seen and examined. Medications ordered.    Vital signs reviewed and are as follows: Filed Vitals:   05/16/12 2129  BP: 141/83  Pulse: 99  Temp: 98.7 F (37.1 C)  Resp: 18   Patient was seen and examined. No red flag s/s of low back pain. Patient was counseled on back pain precautions and told to do activity as tolerated but do not lift, push, or pull heavy objects more than 10 pounds for the next week.  Patient counseled to use ice or heat on back for no longer than 15 minutes every hour.   Patient prescribed muscle relaxer and counseled on proper use of muscle relaxant medication.    Patient prescribed narcotic pain medicine and counseled on proper use of narcotic pain medications. Counseled not to combine this medication with others containing tylenol.   Urged patient not to drink alcohol, drive, or perform any other activities that requires focus while taking either of these medications.  Patient urged to follow-up with PCP if pain does not improve with treatment and rest or if pain becomes recurrent. Urged to return with worsening severe pain, loss of bowel or bladder control, trouble walking.   The patient verbalizes understanding and agrees with the plan.  MDM  Patient with back pain, sciatica sx. No neurological deficits. Patient is ambulatory. No warning symptoms of back pain including: loss of bowel or bladder control, night sweats, waking from sleep with back pain, unexplained fevers or weight loss, h/o cancer, IVDU, recent trauma. No concern for cauda equina, epidural abscess, or other serious cause of back pain. Conservative measures such as rest, ice/heat and pain medicine indicated with PCP follow-up if no improvement with conservative management.          [place], [place] 05/19/12 [phone]

## 2012-05-17 NOTE — ED Notes (Signed)
Pt's family states pt began having acute onset of lower back pain having twisted his torso yesterday approx 0900. Pt reports increased pain w/ movement and ambulation.

## 2012-05-20 ENCOUNTER — Encounter (HOSPITAL_COMMUNITY): Payer: Self-pay | Admitting: Emergency Medicine

## 2012-05-20 ENCOUNTER — Emergency Department (HOSPITAL_COMMUNITY)
Admission: EM | Admit: 2012-05-20 | Discharge: 2012-05-20 | Disposition: A | Discharge status: Home / Self Care | Payer: Self-pay | Attending: Emergency Medicine | Admitting: Emergency Medicine

## 2012-05-20 DIAGNOSIS — M545 Low back pain, unspecified: Secondary | ICD-10-CM | POA: Insufficient documentation

## 2012-05-20 DIAGNOSIS — F17200 Nicotine dependence, unspecified, uncomplicated: Secondary | ICD-10-CM | POA: Insufficient documentation

## 2012-05-20 DIAGNOSIS — M5432 Sciatica, left side: Secondary | ICD-10-CM

## 2012-05-20 DIAGNOSIS — M5430 Sciatica, unspecified side: Secondary | ICD-10-CM | POA: Insufficient documentation

## 2012-05-20 MED ORDER — OXYCODONE-ACETAMINOPHEN 5-325 MG PO TABS
2.0000 | ORAL_TABLET | Freq: Once | ORAL | Status: AC
  Administered 2012-05-20: 2 via ORAL
  Filled 2012-05-20: qty 2

## 2012-05-20 MED ORDER — PREDNISONE 20 MG PO TABS: ORAL_TABLET | ORAL | Status: AC

## 2012-05-20 MED ORDER — OXYCODONE-ACETAMINOPHEN 5-325 MG PO TABS: 1.0000 | ORAL_TABLET | Freq: Four times a day (QID) | ORAL | Status: AC | PRN

## 2012-05-20 NOTE — ED Provider Notes (Signed)
Medical screening examination/treatment/procedure(s) were performed by non-physician practitioner and as supervising physician I was immediately available for consultation/collaboration.  Gerhard Munch, MD 05/20/12 [phone]

## 2012-05-20 NOTE — ED Provider Notes (Signed)
History     CSN: [id number]  Arrival date & time 05/20/12  2024   First MD Initiated Contact with Patient 05/20/12 2212      Chief Complaint  Patient presents with  . Back Pain    (Consider location/radiation/quality/duration/timing/severity/associated sxs/prior treatment) HPI  31 year old male presents complaining of low back pain. History obtain through interpreter. Patient states for the past 5 days he has been having pain from his left buttock radiating down to the back of his left thigh. Pain initially started when he was painting the floor, he stands up presents back and noticed an instant pain. He described pain as a sharp and throbbing sensation. Pain worsened with sitting, positional change, and walking. Denies history of back pain. Denies fever, chills, rash, urinary or bowel incontinence, numbness or weakness. Patient was seen in the ED 4 days ago and was given Norco, Robaxin, and naproxen. He has been taking the medication which only provides minimal relief. He does not have the money for followup with orthopedist.    History reviewed. No pertinent past medical history.  Past Surgical History  Procedure Date  . Knee surgery     No family history on file.  History  Substance Use Topics  . Smoking status: Current Some Day Smoker  . Smokeless tobacco: Not on file  . Alcohol Use: No      Review of Systems  Constitutional: Negative for fever.  Cardiovascular: Negative for leg swelling.  Gastrointestinal: Negative for constipation.  Genitourinary: Negative for testicular pain.  Musculoskeletal: Positive for back pain and gait problem.  Neurological: Negative for weakness and numbness.  All other systems reviewed and are negative.    Allergies  Review of patient's allergies indicates no known allergies.  Home Medications   Current Outpatient Rx  Name Route Sig Dispense Refill  . HYDROCODONE-ACETAMINOPHEN 5-325 MG PO TABS  Take 1-2 tablets every 6 hours as  needed for severe pain 10 tablet 0  . METHOCARBAMOL 500 MG PO TABS Oral Take 2 tablets (1,000 mg total) by mouth 4 (four) times daily. 20 tablet 0  . NAPROXEN 500 MG PO TABS Oral Take 1 tablet (500 mg total) by mouth 2 (two) times daily. 20 tablet 0    BP 145/89  Pulse 86  Temp 98 F (36.7 C) (Oral)  Resp 16  SpO2 99%  Physical Exam  Nursing note and vitals reviewed. Constitutional: He appears well-developed and well-nourished.       Large body habitus  Eyes: Conjunctivae are normal.  Neck: Neck supple.  Abdominal: Soft. There is no tenderness.       No CVA tenderness  Musculoskeletal:       Cervical back: Normal.       Thoracic back: Normal.       Lumbar back: Normal.       Increase low back pain with straight leg raise past 40 degree.  Patella DTR 2+, sensation intact to light touch to lower extremity, no foot drop.  No midline spine tenderness.      ED Course  Procedures (including critical care time)  Labs Reviewed - No data to display No results found.   No diagnosis found.  No red flag s/s of low back pain. Patient was counseled on back pain precautions and told to do activity as tolerated but do not lift, push, or pull heavy objects more than 10 pounds for the next week. Patient counseled to use ice or heat on back for no longer than 15  minutes every hour.   Patient prescribed muscle relaxer and counseled on proper use of muscle relaxant medication.  Patient prescribed narcotic pain medicine and counseled on proper use of narcotic pain medications.  Counseled not to combine this medication with others containing tylenol.  Urged patient not to drink alcohol, drive, or perform any other activities that requires focus while taking either of these medications.   Patient urged to follow-up with PCP if pain does not improve with treatment and rest or if pain becomes recurrent. Urged to return with worsening severe pain, loss of bowel or bladder control, trouble walking.    The patient verbalizes understanding and agrees with the plan.  1. Sciatica, left  MDM  Pt presents with pain consistent with sciatica.  No red flag s/s.  Pt was seen recently for same.  Pain medication not well control, likely due to patient's body habitus.  Will add steroid and refill pain meds.  Care instruction given.  Referral given.        Fayrene Helper, PA-C 05/20/12 2239

## 2012-05-20 NOTE — ED Notes (Signed)
Pt alert, nad, arrives from home, c/o low back pain, seen in ED several days ago, returns with cont pain, denies trauma or injury, resp even unlabored, skin pwd

## 2012-05-20 NOTE — ED Notes (Signed)
Pt reports pain on left lower back radiating down left leg.  No injury reported.  Pain has not responded to medication he received when evaluated here on Monday.

## 2012-05-23 NOTE — ED Provider Notes (Signed)
Medical screening examination/treatment/procedure(s) were performed by non-physician practitioner and as supervising physician I was immediately available for consultation/collaboration.   Lyanne Co, MD 05/23/12 1012

## 2013-02-22 ENCOUNTER — Emergency Department (HOSPITAL_COMMUNITY)
Admission: EM | Admit: 2013-02-22 | Discharge: 2013-02-22 | Disposition: A | Discharge status: Home / Self Care | Payer: Self-pay | Attending: Emergency Medicine | Admitting: Emergency Medicine

## 2013-02-22 ENCOUNTER — Encounter (HOSPITAL_COMMUNITY): Payer: Self-pay | Admitting: *Deleted

## 2013-02-22 DIAGNOSIS — G8929 Other chronic pain: Secondary | ICD-10-CM

## 2013-02-22 DIAGNOSIS — F17200 Nicotine dependence, unspecified, uncomplicated: Secondary | ICD-10-CM | POA: Insufficient documentation

## 2013-02-22 DIAGNOSIS — M5432 Sciatica, left side: Secondary | ICD-10-CM

## 2013-02-22 DIAGNOSIS — M545 Low back pain, unspecified: Secondary | ICD-10-CM | POA: Insufficient documentation

## 2013-02-22 DIAGNOSIS — G8921 Chronic pain due to trauma: Secondary | ICD-10-CM | POA: Insufficient documentation

## 2013-02-22 DIAGNOSIS — M25562 Pain in left knee: Secondary | ICD-10-CM

## 2013-02-22 DIAGNOSIS — M25569 Pain in unspecified knee: Secondary | ICD-10-CM | POA: Insufficient documentation

## 2013-02-22 DIAGNOSIS — M5430 Sciatica, unspecified side: Secondary | ICD-10-CM | POA: Insufficient documentation

## 2013-02-22 MED ORDER — DIAZEPAM 5 MG PO TABS
10.0000 mg | ORAL_TABLET | Freq: Once | ORAL | Status: AC
  Administered 2013-02-22: 10 mg via ORAL
  Filled 2013-02-22: qty 2

## 2013-02-22 MED ORDER — HYDROCODONE-ACETAMINOPHEN 5-325 MG PO TABS
2.0000 | ORAL_TABLET | Freq: Once | ORAL | Status: AC
  Administered 2013-02-22: 2 via ORAL
  Filled 2013-02-22: qty 2

## 2013-02-22 MED ORDER — HYDROCODONE-ACETAMINOPHEN 5-325 MG PO TABS: 1.0000 | ORAL_TABLET | Freq: Four times a day (QID) | ORAL | Status: DC | PRN

## 2013-02-22 MED ORDER — NAPROXEN 500 MG PO TABS: 500.0000 mg | ORAL_TABLET | Freq: Two times a day (BID) | ORAL | Status: DC | PRN

## 2013-02-22 MED ORDER — METHOCARBAMOL 750 MG PO TABS: 750.0000 mg | ORAL_TABLET | Freq: Four times a day (QID) | ORAL | Status: DC | PRN

## 2013-02-22 NOTE — ED Provider Notes (Signed)
History    This chart was scribed for non-physician practitioner working with Douglas Anger, DO by [name], ED Scribe. This patient was seen in room TR11C/TR11C and the patient's care was started at 2039.   CSN: [id number]  Arrival date & time 02/22/13  2039   First MD Initiated Contact with Patient 02/22/13 2205      Chief Complaint  Patient presents with  . Knee Pain     The history is provided by the patient and medical records. No language interpreter was used.    [name] is a 32 y.o. male who presents to the Emergency Department complaining of a new episode of gradually worsening, constant L knee pain starting 2 days ago. He reports having surgery on the L knee 4 years ago but denies having any recent injury to the area. Denies fever, chills, nausea, vomiting. He states that every so often he has a flare like this and it causes him to have difficulty at work.  He has not seen an orthopedist for this recently.  He takes tylenol which helps the pain and walking and moving around make it worse.  Pt also complains of pain in the back that stared 1 day ago. He denies numbness or tingling in his feet. Denies changes in bowel or bladder function, loss of feeling or function in lower extremities. Pt states this back pain is also chronic with acute flares.  Pt states pain is always on the L side in the l-spine region and is worsened by activity.  Pain is sharp and burning, radiating into the L hip and anterior thigh and rated as moderate.    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  He has had no known fall or injury to aggravate his back or knee.  Pt is a current everyday smoker and occasional alcohol user.  No past medical history on file.  Past Surgical History  Procedure Laterality Date  . Knee surgery    . Appendectomy      No family history on file.  History  Substance Use Topics  . Smoking status: Current Some Day Smoker -- 0.10  packs/day    Types: Cigarettes  . Smokeless tobacco: Not on file  . Alcohol Use: Yes     Comment: on saturdays      Review of Systems  Constitutional: Negative for fever, chills, diaphoresis, appetite change, fatigue and unexpected weight change.  HENT: Negative for mouth sores, neck pain and neck stiffness.   Eyes: Negative for visual disturbance.  Respiratory: Negative for cough, chest tightness, shortness of breath and wheezing.   Cardiovascular: Negative for chest pain.  Gastrointestinal: Negative for nausea, vomiting, abdominal pain, diarrhea and constipation.  Endocrine: Negative for polydipsia, polyphagia and polyuria.  Genitourinary: Negative for dysuria, urgency, frequency and hematuria.  Musculoskeletal: Positive for back pain, joint swelling, arthralgias and gait problem (2/2 pain).  Skin: Negative for rash.  Allergic/Immunologic: Negative for immunocompromised state.  Neurological: Negative for syncope, weakness, light-headedness and headaches.  Hematological: Does not bruise/bleed easily.  Psychiatric/Behavioral: Negative for sleep disturbance. The patient is not nervous/anxious.     Allergies  Review of patient's allergies indicates no known allergies.  Home Medications   Current Outpatient Rx  Name  Route  Sig  Dispense  Refill  . acetaminophen (TYLENOL) 500 MG tablet   Oral   Take 1,000 mg by mouth every 6 (six) hours as needed for pain.         Marland Kitchen  HYDROcodone-acetaminophen (NORCO/VICODIN) 5-325 MG per tablet   Oral   Take 1 tablet by mouth every 6 (six) hours as needed for pain (Take 1 - 2 tablets every 4 - 6 hours.).   20 tablet   0   . methocarbamol (ROBAXIN) 750 MG tablet   Oral   Take 1 tablet (750 mg total) by mouth 4 (four) times daily as needed (Take 1 tablet every 6 hours as needed for muscle spasms.).   20 tablet   0   . naproxen (NAPROSYN) 500 MG tablet   Oral   Take 1 tablet (500 mg total) by mouth 2 (two) times daily as needed.   30  tablet   0     BP 153/91  Pulse 80  Temp(Src) 97.6 F (36.4 C) (Oral)  Resp 18  SpO2 98%  Physical Exam  Nursing note and vitals reviewed. Constitutional: He is oriented to person, place, and time. He appears well-developed and well-nourished. No distress.  HENT:  Head: Normocephalic and atraumatic.  Mouth/Throat: Oropharynx is clear and moist. No oropharyngeal exudate.  Eyes: Conjunctivae and EOM are normal. Pupils are equal, round, and reactive to light.  Neck: Normal range of motion. Neck supple.  Full ROM without pain  Cardiovascular: Normal rate, regular rhythm and intact distal pulses.   Capillary refill normal.   Pulmonary/Chest: Effort normal and breath sounds normal. No respiratory distress. He has no wheezes.  Abdominal: Soft. He exhibits no distension. There is no tenderness.  Musculoskeletal: He exhibits tenderness. He exhibits no edema.       Left knee: He exhibits swelling. He exhibits normal range of motion, no effusion, no ecchymosis, no deformity, no laceration and no erythema. Tenderness found. Medial joint line and lateral joint line tenderness noted.       Legs: ROM: full ROM  Well healed incision on left knee. Medial and lateral joint line tenderness. No effusion.  Positive straight leg raise on L side.    Lymphadenopathy:    He has no cervical adenopathy.  Neurological: He is alert and oriented to person, place, and time. He has normal reflexes. Coordination normal. GCS eye subscore is 4. GCS verbal subscore is 5. GCS motor subscore is 6.  Reflex Scores:      Tricep reflexes are 2+ on the right side and 2+ on the left side.      Bicep reflexes are 2+ on the right side and 2+ on the left side.      Brachioradialis reflexes are 2+ on the right side and 2+ on the left side.      Patellar reflexes are 2+ on the right side and 2+ on the left side.      Achilles reflexes are 2+ on the right side and 2+ on the left side. Speech is clear and goal oriented,  follows commands Normal strength in upper and lower extremities bilaterally including dorsiflexion and plantar flexion, strong and equal grip strength Sensation normal to light and sharp touch Moves extremities without ataxia, coordination intact Normal gait and balance   Skin: Skin is warm and dry. No rash noted. He is not diaphoretic. No erythema.  No tenting of the skin  Psychiatric: He has a normal mood and affect.    ED Course  Procedures (including critical care time)  DIAGNOSTIC STUDIES: Oxygen Saturation is 98% on room air, normal by my interpretation.    COORDINATION OF CARE: 10:37 PM -Discussed treatment plan with pt at bedside and pt agreed to plan.  Labs Reviewed - No data to display No results found.   1. Chronic knee pain, left   2. Chronic back pain greater than 3 months duration   3. Sciatica, left   4. Low back pain   5. Sciatica, left [724.3]       MDM  [name] presents with chronic knee and and back pain.  Patient with back pain consistent with sciatica.  No neurological deficits and normal neuro exam.  Patient can walk but states is painful.  No loss of bowel or bladder control.  No concern for cauda equina.  No fever, night sweats, weight loss, h/o cancer, IVDU.  No trauma, fall or known injury therefore no imaging noted at this time. Pain managed in ED. Pt advised to follow up with orthopedics if symptoms persist. Patient given brace while in ED, conservative therapy including RICE therapy recommended and discussed. Patient will be dc home & is agreeable with above plan.  I personally performed the services described in this documentation, which was scribed in my presence. The recorded information has been reviewed and is accurate.   Dahlia Client Lynnell Fiumara, PA-C 02/23/13 [phone]

## 2013-02-22 NOTE — ED Notes (Signed)
Pt states he had surgery on left knee years ago.  States left knee has been hurting x 2 days. Able to bear weight, but causes pain.  Denies injury.

## 2013-02-23 NOTE — ED Provider Notes (Signed)
Medical screening examination/treatment/procedure(s) were performed by non-physician practitioner and as supervising physician I was immediately available for consultation/collaboration.   [name], DO 02/23/13 1529

## 2013-05-10 ENCOUNTER — Emergency Department (HOSPITAL_COMMUNITY)
Admission: EM | Admit: 2013-05-10 | Discharge: 2013-05-10 | Disposition: A | Discharge status: Home / Self Care | Payer: Self-pay | Attending: Emergency Medicine | Admitting: Emergency Medicine

## 2013-05-10 ENCOUNTER — Encounter (HOSPITAL_COMMUNITY): Payer: Self-pay | Admitting: Emergency Medicine

## 2013-05-10 ENCOUNTER — Emergency Department (HOSPITAL_COMMUNITY): Payer: Self-pay

## 2013-05-10 DIAGNOSIS — Y929 Unspecified place or not applicable: Secondary | ICD-10-CM | POA: Insufficient documentation

## 2013-05-10 DIAGNOSIS — S96911A Strain of unspecified muscle and tendon at ankle and foot level, right foot, initial encounter: Secondary | ICD-10-CM

## 2013-05-10 DIAGNOSIS — Y9301 Activity, walking, marching and hiking: Secondary | ICD-10-CM | POA: Insufficient documentation

## 2013-05-10 DIAGNOSIS — I10 Essential (primary) hypertension: Secondary | ICD-10-CM | POA: Insufficient documentation

## 2013-05-10 DIAGNOSIS — F17200 Nicotine dependence, unspecified, uncomplicated: Secondary | ICD-10-CM | POA: Insufficient documentation

## 2013-05-10 DIAGNOSIS — X500XXA Overexertion from strenuous movement or load, initial encounter: Secondary | ICD-10-CM | POA: Insufficient documentation

## 2013-05-10 DIAGNOSIS — S93409A Sprain of unspecified ligament of unspecified ankle, initial encounter: Secondary | ICD-10-CM | POA: Insufficient documentation

## 2013-05-10 DIAGNOSIS — E669 Obesity, unspecified: Secondary | ICD-10-CM | POA: Insufficient documentation

## 2013-05-10 HISTORY — DX: Essential (primary) hypertension: I10

## 2013-05-10 HISTORY — DX: Obesity, unspecified: E66.9

## 2013-05-10 MED ORDER — TRAMADOL HCL 50 MG PO TABS
50.0000 mg | ORAL_TABLET | Freq: Once | ORAL | Status: AC
  Administered 2013-05-10: 50 mg via ORAL
  Filled 2013-05-10: qty 1

## 2013-05-10 MED ORDER — TRAMADOL HCL 50 MG PO TABS: 50.0000 mg | ORAL_TABLET | Freq: Four times a day (QID) | ORAL | Status: DC | PRN

## 2013-05-10 NOTE — ED Provider Notes (Signed)
This chart was scribed for  Darylene Price,  a non-physician practitioner working with Richardean Canal, MD by Lewanda Rife, ED Scribe. This patient was seen in room [facility] and the patient's care was started at 2125.     History    CSN: [id number] Arrival date & time 05/10/13  2024  First MD Initiated Contact with Patient 05/10/13 2053     Chief Complaint  Patient presents with  . Ankle Pain   (Consider location/radiation/quality/duration/timing/severity/associated sxs/prior Treatment) The history is provided by the patient. A language interpreter was used.   HPI Comments: [name] is a 32 y.o. male who presents to the Emergency Department complaining of constant moderate right ankle pain and swelling onset PTA after stepping into a ditch and rolling his right ankle. Describes pain as non-radiating and throbbing. Denies other associated injuries. Reports symptoms are aggravated with weight bearing and alleviated at rest. Denies taking any OTC medications to relieve symptoms.    Past Medical History  Diagnosis Date  . Hypertension   . Obesity    Past Surgical History  Procedure Laterality Date  . Knee surgery    . Appendectomy    . Knee surgery     No family history on file. History  Substance Use Topics  . Smoking status: Current Some Day Smoker -- 0.10 packs/day    Types: Cigarettes  . Smokeless tobacco: Not on file  . Alcohol Use: Yes     Comment: on saturdays    Review of Systems  Musculoskeletal: Positive for joint swelling (right ankle).  All other systems reviewed and are negative.  A complete 10 system review of systems was obtained and all systems are negative except as noted in the HPI and PMH.     Allergies  Review of patient's allergies indicates no known allergies.  Home Medications   Current Outpatient Rx  Name  Route  Sig  Dispense  Refill  . acetaminophen (TYLENOL) 500 MG tablet   Oral   Take 1,000 mg by mouth every 6 (six) hours as  needed for pain.         Marland Kitchen HYDROcodone-acetaminophen (NORCO/VICODIN) 5-325 MG per tablet   Oral   Take 1 tablet by mouth every 6 (six) hours as needed for pain (Take 1 - 2 tablets every 4 - 6 hours.).   20 tablet   0   . methocarbamol (ROBAXIN) 750 MG tablet   Oral   Take 1 tablet (750 mg total) by mouth 4 (four) times daily as needed (Take 1 tablet every 6 hours as needed for muscle spasms.).   20 tablet   0   . naproxen (NAPROSYN) 500 MG tablet   Oral   Take 1 tablet (500 mg total) by mouth 2 (two) times daily as needed.   30 tablet   0    BP 154/93  Pulse 97  Temp(Src) 98.5 F (36.9 C) (Oral)  Resp 14  SpO2 97%  Physical Exam  Nursing note and vitals reviewed. Constitutional: He is oriented to person, place, and time. He appears well-developed and well-nourished. No distress.  HENT:  Head: Normocephalic and atraumatic.  Eyes: Conjunctivae and EOM are normal. No scleral icterus.  Neck: Neck supple. No tracheal deviation present.  Cardiovascular: Normal rate, regular rhythm and intact distal pulses.   Pulses:      Dorsalis pedis pulses are 2+ on the right side.       Posterior tibial pulses are 2+ on the right side.  Pulmonary/Chest: Effort normal. No respiratory distress.  Musculoskeletal: Normal range of motion.       Right ankle: He exhibits swelling (Lateral malleolus). He exhibits normal range of motion, no ecchymosis and no deformity. Tenderness. Lateral malleolus tenderness found. Achilles tendon normal.       Left lower leg: Normal.       Left foot: Normal.  Neurological: He is alert and oriented to person, place, and time. He has normal strength and normal reflexes. No sensory deficit.  Reflex Scores:      Patellar reflexes are 2+ on the right side.      Achilles reflexes are 2+ on the right side. No sensory or motor deficits appreciated  Skin: Skin is warm and dry. No rash noted. No erythema. No pallor.  Psychiatric: He has a normal mood and affect. His  behavior is normal.    ED Course  Procedures (including critical care time) Medications  traMADol (ULTRAM) tablet 50 mg (50 mg Oral Given 05/10/13 2202)    Labs Reviewed - No data to display Dg Ankle Complete Right  05/10/2013   *RADIOLOGY REPORT*  Clinical Data: Right ankle pain and swelling.  RIGHT ANKLE - COMPLETE 3+ VIEW  Comparison: None.  Findings: There is some soft tissue swelling about the lateral malleolus.  No fracture or other focal bony abnormality is identified.  No tibiotalar joint effusion is seen.  IMPRESSION: Lateral soft tissue swelling without underlying bony or joint abnormality.   Original Report Authenticated By: Holley Dexter, M.D.   1. Right ankle strain, initial encounter     MDM  Uncomplicated right ankle strain. Patient neurovascularly intact with normal reflexes and sensation. X-ray without evidence of bony dislocation or fracture; soft tissue swelling appreciated laterally. He has no ankle applied and ED and crutches provided for weightbearing as tolerated. Patient given Ultram to take as needed for pain. RICE instructions also provided. Indications for ED return discussed the patient verbalizes comfort and understanding of plan.  I personally performed the services described in this documentation, which was scribed in my presence. The recorded information has been reviewed and is accurate.     Douglas Madura, PA-C 05/11/13 [phone]

## 2013-05-10 NOTE — ED Notes (Signed)
Pt dc to home. Pt sts understanding to dc instructions. Pt ambulatory to exit with family without difficulty. Pt denies need for w/c.

## 2013-05-10 NOTE — ED Notes (Addendum)
PT. STEPPED ON A HOLE WHILE WALKING AND TWISTED HIS RIGHT ANKLE THIS EVENING , PRESENTS WITH RIGHT ANKLE SWELLING/PAIN . GIRLFRIEND TRANSLATING ( SPANISH) FOR PT.

## 2013-05-10 NOTE — Progress Notes (Signed)
Orthopedic Tech Progress Note Patient Details:  [name] [birth year] [id number]  Ortho Devices Type of Ortho Device: ASO;Crutches Ortho Device/Splint Location: RLE Ortho Device/Splint Interventions: Ordered;Application   Jennye Moccasin 05/10/2013, 10:00 PM

## 2013-05-12 NOTE — ED Provider Notes (Signed)
Medical screening examination/treatment/procedure(s) were performed by non-physician practitioner and as supervising physician I was immediately available for consultation/collaboration.   Richardean Canal, MD 05/12/13 [phone]

## 2013-07-02 ENCOUNTER — Emergency Department (HOSPITAL_COMMUNITY)
Admission: EM | Admit: 2013-07-02 | Discharge: 2013-07-02 | Disposition: A | Discharge status: Home / Self Care | Payer: Self-pay | Attending: Emergency Medicine | Admitting: Emergency Medicine

## 2013-07-02 ENCOUNTER — Encounter (HOSPITAL_COMMUNITY): Payer: Self-pay | Admitting: *Deleted

## 2013-07-02 DIAGNOSIS — T6391XA Toxic effect of contact with unspecified venomous animal, accidental (unintentional), initial encounter: Secondary | ICD-10-CM | POA: Insufficient documentation

## 2013-07-02 DIAGNOSIS — I10 Essential (primary) hypertension: Secondary | ICD-10-CM | POA: Insufficient documentation

## 2013-07-02 DIAGNOSIS — E669 Obesity, unspecified: Secondary | ICD-10-CM | POA: Insufficient documentation

## 2013-07-02 DIAGNOSIS — T63461A Toxic effect of venom of wasps, accidental (unintentional), initial encounter: Secondary | ICD-10-CM | POA: Insufficient documentation

## 2013-07-02 DIAGNOSIS — F17200 Nicotine dependence, unspecified, uncomplicated: Secondary | ICD-10-CM | POA: Insufficient documentation

## 2013-07-02 DIAGNOSIS — Y9389 Activity, other specified: Secondary | ICD-10-CM | POA: Insufficient documentation

## 2013-07-02 DIAGNOSIS — M7989 Other specified soft tissue disorders: Secondary | ICD-10-CM | POA: Insufficient documentation

## 2013-07-02 DIAGNOSIS — Y929 Unspecified place or not applicable: Secondary | ICD-10-CM | POA: Insufficient documentation

## 2013-07-02 MED ORDER — RANITIDINE HCL 150 MG/10ML PO SYRP: 150.0000 mg | ORAL_SOLUTION | Freq: Once | ORAL | Status: DC

## 2013-07-02 MED ORDER — FAMOTIDINE 20 MG PO TABS
20.0000 mg | ORAL_TABLET | Freq: Once | ORAL | Status: AC
  Administered 2013-07-02: 20 mg via ORAL
  Filled 2013-07-02: qty 1

## 2013-07-02 MED ORDER — DIPHENHYDRAMINE HCL 25 MG PO CAPS
50.0000 mg | ORAL_CAPSULE | Freq: Once | ORAL | Status: AC
  Administered 2013-07-02: 50 mg via ORAL
  Filled 2013-07-02: qty 2

## 2013-07-02 MED ORDER — PREDNISONE 20 MG PO TABS
60.0000 mg | ORAL_TABLET | Freq: Once | ORAL | Status: AC
  Administered 2013-07-02: 60 mg via ORAL
  Filled 2013-07-02: qty 3

## 2013-07-02 NOTE — ED Notes (Addendum)
Pt states that he was stung by a bee this afternoon right wrist around 14:00. Pt states that his entire right arm is swollen and hurting. Pt worried about allergic reaction because he has not been stung before. Pt has not respiratory distress noted

## 2013-07-02 NOTE — ED Notes (Signed)
PT/family state they understand discharge instructions

## 2013-07-02 NOTE — ED Provider Notes (Signed)
I saw and evaluated the patient, reviewed the resident's note and I agree with the findings and plan.  Localized pain and edema right hand without systemic symptoms doubt anaphylaxis.  Hurman Horn, MD 07/04/13 [phone]

## 2013-07-02 NOTE — ED Provider Notes (Signed)
CSN: [id number]     Arrival date & time 07/02/13  2148 History     First MD Initiated Contact with Patient 07/02/13 2214     Chief Complaint  Patient presents with  . Insect Bite  . Allergic Reaction   (Consider location/radiation/quality/duration/timing/severity/associated sxs/prior Treatment) HPI 32 year old male with history of hypertension presents following bee sting to the right wrist.  Patient reports he was stung at approximately 1400, and at the area of pain and swelling in the area staying has increased.  Patient reports pain enteritis located around the area staying and extending up his arm. His pain is 8/10. Has never been stung by a bee before. Has no history of prior allergic reactions. Patient denies any shortness of breath, lightheadedness, oral or throat swelling, nausea, vomiting, diarrhea, cough, rash.   Past Medical History  Diagnosis Date  . Hypertension   . Obesity    Past Surgical History  Procedure Laterality Date  . Knee surgery    . Appendectomy    . Knee surgery    . Nose surgery     History reviewed. No pertinent family history. History  Substance Use Topics  . Smoking status: Current Some Day Smoker -- 0.10 packs/day    Types: Cigarettes  . Smokeless tobacco: Not on file  . Alcohol Use: Yes     Comment: on saturdays    Review of Systems  Constitutional: Negative for fever.  HENT: Negative for sore throat and neck stiffness.   Eyes: Negative for visual disturbance.  Respiratory: Negative for shortness of breath.   Cardiovascular: Negative for chest pain.  Gastrointestinal: Negative for nausea, vomiting, abdominal pain and diarrhea.  Genitourinary: Negative for difficulty urinating.  Musculoskeletal: Negative for back pain.  Skin: Negative for rash.  Allergic/Immunologic: Negative for environmental allergies.  Neurological: Negative for syncope, light-headedness and headaches.    Allergies  Review of patient's allergies indicates no known  allergies.  Home Medications  No current outpatient prescriptions on file. BP 151/82  Pulse 96  Temp(Src) 97.7 F (36.5 C) (Oral)  Resp 18  SpO2 98% Physical Exam  Nursing note and vitals reviewed. Constitutional: He is oriented to person, place, and time. He appears well-developed and well-nourished. No distress.  HENT:  Head: Normocephalic and atraumatic.  Nose: Nose normal.  Mouth/Throat: Oropharynx is clear and moist. No oropharyngeal exudate.  Eyes: Conjunctivae and EOM are normal.  Neck: Normal range of motion.  Cardiovascular: Normal rate, regular rhythm, normal heart sounds and intact distal pulses.  Exam reveals no gallop and no friction rub.   No murmur heard. Pulmonary/Chest: Effort normal and breath sounds normal. No respiratory distress. He has no wheezes. He has no rales.  Abdominal: Soft. He exhibits no distension. There is no tenderness. There is no guarding.  Musculoskeletal: He exhibits edema (right wrist and hand) and tenderness (right wrist).  Neurological: He is alert and oriented to person, place, and time.  Skin: Skin is warm and dry. He is not diaphoretic.  Punctate area of sting, mild surrounding erythema and edema    ED Course   Procedures (including critical care time)  Labs Reviewed - No data to display No results found. 1. Local reaction to bee sting, initial encounter     MDM  32 year old male with history of hypertension presents following bee sting to the right.  Patient without symptoms of anaphylaxis including or shortness of breath, vomiting, lightheadedness, rash, oral or throat swelling.   Mild amount of swelling noted to the right  wrist extending towards the hand, without signs of cellulitis.  Patient given Benadryl, Zantac, and one dose of prednisone while in the emergency department.  Patient with local reaction to insect sting. Patient discharged in stable condition with understanding of reasons to return.   Rhae Lerner,  MD 07/02/13 [phone]

## 2013-07-02 NOTE — ED Notes (Signed)
Right arm swelling noted with redness

## 2013-11-06 ENCOUNTER — Encounter (HOSPITAL_COMMUNITY): Payer: Self-pay | Admitting: Emergency Medicine

## 2013-11-06 ENCOUNTER — Emergency Department (HOSPITAL_COMMUNITY): Payer: Self-pay

## 2013-11-06 DIAGNOSIS — R6883 Chills (without fever): Secondary | ICD-10-CM | POA: Insufficient documentation

## 2013-11-06 DIAGNOSIS — J029 Acute pharyngitis, unspecified: Secondary | ICD-10-CM | POA: Insufficient documentation

## 2013-11-06 DIAGNOSIS — R51 Headache: Secondary | ICD-10-CM | POA: Insufficient documentation

## 2013-11-06 DIAGNOSIS — E669 Obesity, unspecified: Secondary | ICD-10-CM | POA: Insufficient documentation

## 2013-11-06 DIAGNOSIS — IMO0001 Reserved for inherently not codable concepts without codable children: Secondary | ICD-10-CM | POA: Insufficient documentation

## 2013-11-06 DIAGNOSIS — R197 Diarrhea, unspecified: Secondary | ICD-10-CM | POA: Insufficient documentation

## 2013-11-06 DIAGNOSIS — Z79899 Other long term (current) drug therapy: Secondary | ICD-10-CM | POA: Insufficient documentation

## 2013-11-06 DIAGNOSIS — I10 Essential (primary) hypertension: Secondary | ICD-10-CM | POA: Insufficient documentation

## 2013-11-06 NOTE — ED Notes (Signed)
Pt. reports persistent dry cough , body aches , chills , low grade fever , sneezing and headache for several days unrelieved by OTC medications .

## 2013-11-07 ENCOUNTER — Emergency Department (HOSPITAL_COMMUNITY)
Admission: EM | Admit: 2013-11-07 | Discharge: 2013-11-07 | Disposition: A | Discharge status: Home / Self Care | Payer: Self-pay | Attending: Emergency Medicine | Admitting: Emergency Medicine

## 2013-11-07 DIAGNOSIS — J111 Influenza due to unidentified influenza virus with other respiratory manifestations: Secondary | ICD-10-CM

## 2013-11-07 MED ORDER — NAPROXEN 500 MG PO TABS: 500.0000 mg | ORAL_TABLET | Freq: Two times a day (BID) | ORAL | Status: DC

## 2013-11-07 MED ORDER — IBUPROFEN 400 MG PO TABS
400.0000 mg | ORAL_TABLET | Freq: Once | ORAL | Status: AC
  Administered 2013-11-07: 400 mg via ORAL
  Filled 2013-11-07: qty 1

## 2013-11-07 MED ORDER — KETOROLAC TROMETHAMINE 60 MG/2ML IM SOLN
60.0000 mg | Freq: Once | INTRAMUSCULAR | Status: AC
  Administered 2013-11-07: 60 mg via INTRAMUSCULAR
  Filled 2013-11-07: qty 2

## 2013-11-07 MED ORDER — OSELTAMIVIR PHOSPHATE 75 MG PO CAPS: 75.0000 mg | ORAL_CAPSULE | Freq: Two times a day (BID) | ORAL | Status: DC

## 2013-11-07 NOTE — ED Provider Notes (Signed)
CSN: [id number]     Arrival date & time 11/06/13  2101 History   First MD Initiated Contact with Patient 11/07/13 0222     Chief Complaint  Patient presents with  . Cough  . Generalized Body Aches  . Chills   (Consider location/radiation/quality/duration/timing/severity/associated sxs/prior Treatment) HPI Comments: 32 year old male, presents with a complaint of diffuse body aches, cough, sore throat and a headache. He has had associated chills. Nothing makes this better or worse, no associated vomiting though he did have diarrhea several days ago. There are multiple family members with similar complaints. Symptoms are persistent  Patient is a 32 y.o. male presenting with cough. The history is provided by the patient and a relative.  Cough   Past Medical History  Diagnosis Date  . Hypertension   . Obesity    Past Surgical History  Procedure Laterality Date  . Knee surgery    . Appendectomy    . Knee surgery    . Nose surgery     No family history on file. History  Substance Use Topics  . Smoking status: Current Some Day Smoker -- 0.10 packs/day    Types: Cigarettes  . Smokeless tobacco: Not on file  . Alcohol Use: Yes     Comment: on saturdays    Review of Systems  Respiratory: Positive for cough.   All other systems reviewed and are negative.    Allergies  Review of patient's allergies indicates no known allergies.  Home Medications   Current Outpatient Rx  Name  Route  Sig  Dispense  Refill  . diphenhydramine-acetaminophen (TYLENOL PM) 25-500 MG TABS   Oral   Take 2 tablets by mouth at bedtime as needed (sleep, and cold).         . DM-Doxylamine-Acetaminophen (NYQUIL COLD & FLU PO)   Oral   Take 30 mLs by mouth at bedtime as needed (cold).         . naproxen (NAPROSYN) 500 MG tablet   Oral   Take 1 tablet (500 mg total) by mouth 2 (two) times daily with a meal.   30 tablet   0   . oseltamivir (TAMIFLU) 75 MG capsule   Oral   Take 1 capsule (75  mg total) by mouth every 12 (twelve) hours.   10 capsule   0    BP 144/81  Pulse 80  Temp(Src) 98 F (36.7 C) (Oral)  Resp 14  Ht 6\' 1"  (1.854 m)  Wt 304 lb (137.893 kg)  BMI 40.12 kg/m2  SpO2 97% Physical Exam  Nursing note and vitals reviewed. Constitutional: He appears well-developed and well-nourished. No distress.  HENT:  Head: Normocephalic and atraumatic.  Mouth/Throat: Oropharynx is clear and moist. No oropharyngeal exudate.  Eyes: Conjunctivae and EOM are normal. Pupils are equal, round, and reactive to light. Right eye exhibits no discharge. Left eye exhibits no discharge. No scleral icterus.  Neck: Normal range of motion. Neck supple. No JVD present. No thyromegaly present.  Cardiovascular: Normal rate, regular rhythm, normal heart sounds and intact distal pulses.  Exam reveals no gallop and no friction rub.   No murmur heard. Pulmonary/Chest: Effort normal and breath sounds normal. No respiratory distress. He has no wheezes. He has no rales.  Abdominal: Soft. Bowel sounds are normal. He exhibits no distension and no mass. There is no tenderness.  Musculoskeletal: Normal range of motion. He exhibits no edema and no tenderness.  Lymphadenopathy:    He has no cervical adenopathy.  Neurological: He  is alert. Coordination normal.  Skin: Skin is warm and dry. No rash noted. No erythema.  Psychiatric: He has a normal mood and affect. His behavior is normal.    ED Course  Procedures (including critical care time) Labs Review Labs Reviewed - No data to display Imaging Review Dg Chest 2 View  11/06/2013   CLINICAL DATA:  Cough, body aches  EXAM: CHEST  2 VIEW  COMPARISON:  None.  FINDINGS: The heart size and mediastinal contours are within normal limits. Both lungs are clear. The visualized skeletal structures are unremarkable.  IMPRESSION: No active cardiopulmonary disease.   Electronically Signed   By: Elige Ko   On: 11/06/2013 22:37    EKG Interpretation   None        MDM   1. Influenza-like illness    The patient's lungs are clear, upper respiratory tract appears clear as well though he does have some swelling of the nasal turbinates. Vital signs reflect no fever, no hypotension and no hypoxia. He will be given Toradol intramuscular, discharged with Tamiflu and Naprosyn. I discussed this with the family member and the patient, they are in agreement. The chest x-ray shows no signs of pneumonia. Patient appears stable   Meds given in ED:  Medications  ketorolac (TORADOL) injection 60 mg (not administered)  ibuprofen (ADVIL,MOTRIN) tablet 400 mg (400 mg Oral Given 11/07/13 0306)    New Prescriptions   NAPROXEN (NAPROSYN) 500 MG TABLET    Take 1 tablet (500 mg total) by mouth 2 (two) times daily with a meal.   OSELTAMIVIR (TAMIFLU) 75 MG CAPSULE    Take 1 capsule (75 mg total) by mouth every 12 (twelve) hours.        Vida Roller, MD 11/07/13 [phone]

## 2013-11-07 NOTE — ED Notes (Signed)
Pt. Reports generalized muscle aches, chills, and "not feeling well" since Friday. Denies fevers.

## 2014-02-22 ENCOUNTER — Emergency Department (HOSPITAL_COMMUNITY)
Admission: EM | Admit: 2014-02-22 | Discharge: 2014-02-22 | Disposition: A | Discharge status: Home / Self Care | Payer: Self-pay | Attending: Emergency Medicine | Admitting: Emergency Medicine

## 2014-02-22 ENCOUNTER — Encounter (HOSPITAL_COMMUNITY): Payer: Self-pay | Admitting: Emergency Medicine

## 2014-02-22 ENCOUNTER — Emergency Department (HOSPITAL_COMMUNITY): Payer: Self-pay

## 2014-02-22 DIAGNOSIS — Z9889 Other specified postprocedural states: Secondary | ICD-10-CM | POA: Insufficient documentation

## 2014-02-22 DIAGNOSIS — X58XXXA Exposure to other specified factors, initial encounter: Secondary | ICD-10-CM

## 2014-02-22 DIAGNOSIS — Y929 Unspecified place or not applicable: Secondary | ICD-10-CM | POA: Insufficient documentation

## 2014-02-22 DIAGNOSIS — X503XXA Overexertion from repetitive movements, initial encounter: Secondary | ICD-10-CM | POA: Insufficient documentation

## 2014-02-22 DIAGNOSIS — S93401A Sprain of unspecified ligament of right ankle, initial encounter: Secondary | ICD-10-CM

## 2014-02-22 DIAGNOSIS — S93409A Sprain of unspecified ligament of unspecified ankle, initial encounter: Secondary | ICD-10-CM | POA: Insufficient documentation

## 2014-02-22 DIAGNOSIS — F17200 Nicotine dependence, unspecified, uncomplicated: Secondary | ICD-10-CM | POA: Insufficient documentation

## 2014-02-22 DIAGNOSIS — M25569 Pain in unspecified knee: Secondary | ICD-10-CM

## 2014-02-22 DIAGNOSIS — R269 Unspecified abnormalities of gait and mobility: Secondary | ICD-10-CM | POA: Insufficient documentation

## 2014-02-22 DIAGNOSIS — Y9389 Activity, other specified: Secondary | ICD-10-CM | POA: Insufficient documentation

## 2014-02-22 MED ORDER — HYDROCODONE-ACETAMINOPHEN 5-325 MG PO TABS: 1.0000 | ORAL_TABLET | ORAL | Status: DC | PRN

## 2014-02-22 MED ORDER — IBUPROFEN 800 MG PO TABS: 800.0000 mg | ORAL_TABLET | Freq: Three times a day (TID) | ORAL | Status: DC

## 2014-02-22 NOTE — ED Notes (Signed)
Pt did not want crutches states they have pair at home

## 2014-02-22 NOTE — ED Notes (Signed)
Per pt, c/o leg pain x 1 1/2 weeks.  Pt states pain to right leg/calf and down to ankle.  Pain increases in calf/ankle with walking.  No heat to leg.

## 2014-02-22 NOTE — ED Provider Notes (Signed)
CSN: [id number]     Arrival date & time 02/22/14  1626 History   First MD Initiated Contact with Patient 02/22/14 1646     Chief Complaint  Patient presents with  . Leg Pain     (Consider location/radiation/quality/duration/timing/severity/associated sxs/prior Treatment) HPI Comments: [name] is a 33 y.o. Male, presenting the Emergency Department with a chief complaint of Right knee pain and right ankle pain.  The patient denis history of knee injury, reports pain with walking up and down steps,  He reports pain as posterior "deep knee pain".  Denies felling of laxicity or weakness.  He reports twisting his ankle a few weeks ago due to pain in his right knee.  Reports pain with ambulation and ROM.   Patient is a 33 y.o. male presenting with leg pain. The history is provided by the patient and a significant other. A language interpreter was used (significant other).  Leg Pain Associated symptoms: no back pain and no fever     History reviewed. No pertinent past medical history. Past Surgical History  Procedure Laterality Date  . Ankle surgery    . Knee surgery    . Nose surgery    . Appendectomy     History reviewed. No pertinent family history. History  Substance Use Topics  . Smoking status: Current Some Day Smoker  . Smokeless tobacco: Not on file  . Alcohol Use: Yes     Comment: sometimes    Review of Systems  Constitutional: Negative for fever and chills.  Cardiovascular: Negative for leg swelling.  Musculoskeletal: Positive for arthralgias, gait problem and joint swelling. Negative for back pain.  Skin: Negative for color change and wound.  Neurological: Negative for weakness and numbness.      Allergies  Review of patient's allergies indicates no known allergies.  Home Medications   Prior to Admission medications   Not on File   BP 136/70  Pulse 77  Resp 18  SpO2 99% Physical Exam  Nursing note and vitals reviewed. Constitutional: He is oriented  to person, place, and time. He appears well-developed and well-nourished. No distress.  HENT:  Head: Normocephalic and atraumatic.  Neck: Neck supple.  Cardiovascular:  Pulses:      Dorsalis pedis pulses are 2+ on the right side, and 2+ on the left side.  Pulmonary/Chest: Effort normal. No respiratory distress.  Musculoskeletal:       Right knee: He exhibits normal range of motion, no swelling, no effusion, no ecchymosis, no deformity, no laceration, no erythema, normal alignment, no LCL laxity, normal patellar mobility, no bony tenderness, normal meniscus and no MCL laxity. No tenderness found. No medial joint line, no lateral joint line, no MCL, no LCL and no patellar tendon tenderness noted.       Right ankle: He exhibits swelling. He exhibits normal range of motion, no ecchymosis, no deformity, no laceration and normal pulse. Tenderness. Lateral malleolus and AITFL tenderness found. No medial malleolus and no head of 5th metatarsal tenderness found. Achilles tendon exhibits no defect.  Right knee: Pain with Active ROM, full active ROM.  No palpable mass behind knee.  Neurological: He is oriented to person, place, and time.  Skin: Skin is warm and dry. He is not diaphoretic.  Psychiatric: He has a normal mood and affect. His behavior is normal.    ED Course  Procedures (including critical care time) Labs Review Labs Reviewed - No data to display  Imaging Review Dg Ankle Complete Right  02/22/2014  CLINICAL DATA:  Ankle pain  EXAM: RIGHT ANKLE - COMPLETE 3+ VIEW  COMPARISON:  None.  FINDINGS: There is no evidence of fracture, dislocation, or joint effusion. There is mild soft tissue swelling laterally.  IMPRESSION: No acute fracture or dislocation.  Mild ankle swelling laterally.   Electronically Signed   By: Sherian ReinWei-Chen  Lin M.D.   On: 02/22/2014 17:28   Dg Knee Complete 4 Views Right  02/22/2014   CLINICAL DATA:  Knee pain  EXAM: RIGHT KNEE - COMPLETE 4+ VIEW  COMPARISON:  None.   FINDINGS: There is no evidence of fracture, dislocation, or joint effusion. There is no evidence of arthropathy or other focal bone abnormality. Soft tissues are unremarkable.  IMPRESSION: Negative.   Electronically Signed   By: Sherian ReinWei-Chen  Lin M.D.   On: 02/22/2014 17:28     EKG Interpretation None      MDM   Final diagnoses:  Knee pain  Right ankle sprain   Pt with knee pain and right ankle pain.  Knee pain likely meniscal.  Ankle injury likely sprain. Negative XR of knee and ankle. RICE and ortho follow up given. Discussed imaging results, and treatment plan with the patient and patient's spouce. Return precautions given. Reports understanding and no other concerns at this time.  Patient is stable for discharge at this time.  Meds given in ED:  Medications - No data to display  New Prescriptions   HYDROCODONE-ACETAMINOPHEN (NORCO/VICODIN) 5-325 MG PER TABLET    Take 1 tablet by mouth every 4 (four) hours as needed.   IBUPROFEN (ADVIL,MOTRIN) 800 MG TABLET    Take 1 tablet (800 mg total) by mouth 3 (three) times daily. Take with food        Clabe SealLauren M Rosalva Neary, PA-C 02/22/14 1753

## 2014-02-22 NOTE — ED Notes (Addendum)
A&Ox4. Girlfriend able to interpret for patient.VSS. Per patient and girlfriend-right calf leg pain started a week ago. A few days after pain started pt twisted his ankle. Reports falling from "pain in knee" and as a result hurting his ankle. Did not seek medical attention. Calf pain present with stretching and bending. Denies warmth, redness and swelling with pain. Denies taking blood thinners and denies being stationary for long periods of time.  Patient resting comfortably.

## 2014-02-22 NOTE — Discharge Instructions (Signed)
Call for a follow up appointment with a Family or Primary Care Provider.  Call for an appointment with an Orthopedic doctor. Return if Symptoms worsen.   Take medication as prescribed.  Ice your knee and ankle 3-4 times a day, elevate when you are not walking or standing.

## 2014-02-26 NOTE — ED Provider Notes (Signed)
Medical screening examination/treatment/procedure(s) were performed by non-physician practitioner and as supervising physician I was immediately available for consultation/collaboration.   EKG Interpretation None       Douglas Delisle M Kalynne Womac, MD 02/26/14 2230 

## 2014-11-06 ENCOUNTER — Encounter (HOSPITAL_COMMUNITY): Payer: Self-pay | Admitting: Emergency Medicine

## 2014-11-06 ENCOUNTER — Emergency Department (HOSPITAL_COMMUNITY)
Admission: EM | Admit: 2014-11-06 | Discharge: 2014-11-06 | Disposition: A | Discharge status: Home / Self Care | Payer: Self-pay | Attending: Emergency Medicine | Admitting: Emergency Medicine

## 2014-11-06 DIAGNOSIS — I10 Essential (primary) hypertension: Secondary | ICD-10-CM | POA: Insufficient documentation

## 2014-11-06 DIAGNOSIS — E669 Obesity, unspecified: Secondary | ICD-10-CM | POA: Insufficient documentation

## 2014-11-06 DIAGNOSIS — H66002 Acute suppurative otitis media without spontaneous rupture of ear drum, left ear: Secondary | ICD-10-CM

## 2014-11-06 DIAGNOSIS — Z720 Tobacco use: Secondary | ICD-10-CM | POA: Insufficient documentation

## 2014-11-06 DIAGNOSIS — Z791 Long term (current) use of non-steroidal anti-inflammatories (NSAID): Secondary | ICD-10-CM | POA: Insufficient documentation

## 2014-11-06 MED ORDER — HYDROCODONE-ACETAMINOPHEN 5-325 MG PO TABS
1.0000 | ORAL_TABLET | Freq: Once | ORAL | Status: AC
  Administered 2014-11-06: 1 via ORAL
  Filled 2014-11-06: qty 1

## 2014-11-06 MED ORDER — NAPROXEN 500 MG PO TABS: 500.0000 mg | ORAL_TABLET | Freq: Two times a day (BID) | ORAL | Status: DC

## 2014-11-06 MED ORDER — NAPROXEN 500 MG PO TABS
500.0000 mg | ORAL_TABLET | Freq: Once | ORAL | Status: AC
  Administered 2014-11-06: 500 mg via ORAL
  Filled 2014-11-06: qty 1

## 2014-11-06 MED ORDER — AMOXICILLIN 500 MG PO CAPS: 500.0000 mg | ORAL_CAPSULE | Freq: Three times a day (TID) | ORAL | Status: DC

## 2014-11-06 MED ORDER — ANTIPYRINE-BENZOCAINE 5.4-1.4 % OT SOLN: 3.0000 [drp] | OTIC | Status: DC | PRN

## 2014-11-06 NOTE — ED Notes (Signed)
Pt having severe throbbing left ear pain. No drainage. Denies fever.

## 2014-11-06 NOTE — ED Provider Notes (Signed)
CSN: [id number]     Arrival date & time 11/06/14  2225 History  This chart was scribed for non-physician practitioner, Harle BattiestElizabeth Jeramey Lanuza, NP working with Elwin MochaBlair Walden, MD, by Abel PrestoKara Demonbreun, ED Scribe. This patient was seen in room WTR7/WTR7 and the patient's care was started at 10:57 PM.     Chief Complaint  Patient presents with  . Otalgia     Patient is a 33 y.o. male presenting with ear pain. The history is provided by the patient and a significant other. No language interpreter was used.  Otalgia Associated symptoms: no cough, no diarrhea, no fever, no rhinorrhea, no sore throat and no vomiting     HPI Comments: [name] is a 33 y.o. male who presents to the Emergency Department complaining of throbbing left ear pain with onset yesterday. Pt notes associated jaw pain and mild right ear pain.  Pt has taken Tylenol with no relief. Pt denies drainage, rhinorrhea, cough, sore throat, tooth pain, fever, chills, nausea, vomiting, diarrhea. Pt's significant other is here to translate. Pt has no PCP.    Past Medical History  Diagnosis Date  . Hypertension   . Obesity    Past Surgical History  Procedure Laterality Date  . Knee surgery    . Appendectomy    . Knee surgery    . Nose surgery     No family history on file. History  Substance Use Topics  . Smoking status: Current Some Day Smoker -- 0.10 packs/day    Types: Cigarettes  . Smokeless tobacco: Not on file  . Alcohol Use: Yes     Comment: on saturdays    Review of Systems  Constitutional: Negative for fever and chills.  HENT: Positive for ear pain. Negative for dental problem, rhinorrhea and sore throat.   Respiratory: Negative for cough.   Gastrointestinal: Negative for nausea, vomiting and diarrhea.      Allergies  Review of patient's allergies indicates no known allergies.  Home Medications   Prior to Admission medications   Medication Sig Start Date End Date Taking? Authorizing Provider   acetaminophen (TYLENOL) 500 MG tablet Take 1,000 mg by mouth every 6 (six) hours as needed (for headache).   Yes Historical Provider, MD  naproxen (NAPROSYN) 500 MG tablet Take 1 tablet (500 mg total) by mouth 2 (two) times daily with a meal. Patient not taking: Reported on 11/06/2014 11/07/13   Vida RollerBrian D Miller, MD  oseltamivir (TAMIFLU) 75 MG capsule Take 1 capsule (75 mg total) by mouth every 12 (twelve) hours. Patient not taking: Reported on 11/06/2014 11/07/13   Vida RollerBrian D Miller, MD   BP 155/94 mmHg  Pulse 97  Temp(Src) 98.5 F (36.9 C) (Oral)  Resp 20  SpO2 99% Physical Exam  Constitutional: He is oriented to person, place, and time. He appears well-developed and well-nourished.  HENT:  Head: Normocephalic.  Right Ear: Tympanic membrane and ear canal normal.  Right canal cerumen noted Left ear canal bulging, TM with mild effusion behind the eardrum   Eyes: Conjunctivae are normal.  Neck: Normal range of motion. Neck supple.  Pulmonary/Chest: Effort normal.  Musculoskeletal: Normal range of motion.  Neurological: He is alert and oriented to person, place, and time.  Skin: Skin is warm and dry.  Psychiatric: He has a normal mood and affect. His behavior is normal.  Nursing note and vitals reviewed.   ED Course  Procedures (including critical care time) DIAGNOSTIC STUDIES: Oxygen Saturation is 99% on room air, normal by my interpretation.  COORDINATION OF CARE: 11:02 PM Discussed treatment plan with patient at beside, the patient agrees with the plan and has no further questions at this time.   Labs Review Labs Reviewed - No data to display  Imaging Review No results found.   EKG Interpretation None      MDM   Final diagnoses:  Acute suppurative otitis media of left ear without spontaneous rupture of tympanic membrane, recurrence not specified   33 yo ear pain and exam consistent with acute otitis media. There is no indication of acute mastoiditis, meningitis.   He has not had used antibiotics in the last month.  Patient discharged home with Amoxicillin. Pt is well-appearing, in no acute distress and vital signs are stable.  They appear safe to be discharged.  Discharge include follow-up with their PCP.  Return precautions provided.  He verbalizes understanding and agrees with plan.   I personally performed the services described in this documentation, which was scribed in my presence. The recorded information has been reviewed and is accurate.  Filed Vitals:   11/06/14 2240 11/06/14 2324  BP: 155/94 145/92  Pulse: 97 92  Temp: 98.5 F (36.9 C)   TempSrc: Oral   Resp: 20   SpO2: 99% 99%   Meds given in ED:  Medications  naproxen (NAPROSYN) tablet 500 mg (500 mg Oral Given 11/06/14 2316)  HYDROcodone-acetaminophen (NORCO/VICODIN) 5-325 MG per tablet 1 tablet (1 tablet Oral Given 11/06/14 2316)    Discharge Medication List as of 11/06/2014 11:09 PM    START taking these medications   Details  amoxicillin (AMOXIL) 500 MG capsule Take 1 capsule (500 mg total) by mouth 3 (three) times daily., Starting 11/06/2014, Until Discontinued, Print    antipyrine-benzocaine (AURALGAN) otic solution Place 3-4 drops into the left ear every 2 (two) hours as needed for ear pain., Starting 11/06/2014, Until Discontinued, Print    !! naproxen (NAPROSYN) 500 MG tablet Take 1 tablet (500 mg total) by mouth 2 (two) times daily., Starting 11/06/2014, Until Discontinued, Print     !! - Potential duplicate medications found. Please discuss with provider.         Harle BattiestElizabeth Ancil Dewan, NP 11/08/14 1731  Elwin MochaBlair Walden, MD 11/11/14 [phone]

## 2014-11-06 NOTE — Discharge Instructions (Signed)
Please follow the directions provided.  Use the resource guide to establish care with a primary care rpovider to follow-up in two days to ensure this ear is getting better. Use the medications as directed.  Don't hesitate to return for any new, worsening or concerning symptoms.    SEEK IMMEDIATE MEDICAL CARE IF:  You have pain that is not controlled with medicine.  You have swelling, redness, or pain around your ear or stiffness in your neck.  You notice that part of your face is paralyzed.  You notice that the bone behind your ear (mastoid) is tender when you touch it.   Emergency Department Resource Guide 1) Find a Doctor and Pay Out of Pocket Although you won't have to find out who is covered by your insurance plan, it is a good idea to ask around and get recommendations. You will then need to call the office and see if the doctor you have chosen will accept you as a new patient and what types of options they offer for patients who are self-pay. Some doctors offer discounts or will set up payment plans for their patients who do not have insurance, but you will need to ask so you aren't surprised when you get to your appointment.  2) Contact Your Local Health Department Not all health departments have doctors that can see patients for sick visits, but many do, so it is worth a call to see if yours does. If you don't know where your local health department is, you can check in your phone book. The CDC also has a tool to help you locate your state's health department, and many state websites also have listings of all of their local health departments.  3) Find a Walk-in Clinic If your illness is not likely to be very severe or complicated, you may want to try a walk in clinic. These are popping up all over the country in pharmacies, drugstores, and shopping centers. They're usually staffed by nurse practitioners or physician assistants that have been trained to treat common illnesses and complaints.  They're usually fairly quick and inexpensive. However, if you have serious medical issues or chronic medical problems, these are probably not your best option.  No Primary Care Doctor: - Call Health Connect at  [phone] - they can help you locate a primary care doctor that  accepts your insurance, provides certain services, etc. - Physician Referral Service- [phone]  Chronic Pain Problems: Organization         Address  Phone   Notes  [facility]  [phone] Patients need to be referred by their primary care doctor.   Medication Assistance: Organization         Address  Phone   Notes  [facility] [address] [place], [place] [address] [phone] --Must be a resident of [place] [place] -- Must have NO insurance coverage whatsoever (no Medicaid/ Medicare, etc.) -- The pt. MUST have a primary care doctor that directs their care regularly and follows them in the community   [facility]  [phone]   [employer]  [phone]) [phone]    Agencies that provide inexpensive medical care: Organization         Address  Phone   Notes  [facility]  [phone]   [facility]    [phone]   [facility] [address] [place], [place] [address] [phone]  [facility] [address] [place] [place] [phone]   Planned Parenthood    [phone]   [facility]    [phone]   [facility]  [address], [place] Phone:  [phone], Fax:  [phone] Hours of Operation:  9 am - 6 pm, M-F.  Also accepts Medicaid/Medicare and self-pay.  [facility]  [address], [place] Phone: [phone], Fax: [phone]. Hours of Operation:  8:30 am - 5:30 pm, M-F.  Also accepts Medicaid and self-pay.  [facility] [place] [address] [place]  Phone: [phone]   [facility] [address] Natasha Bence [place], [place] [phone], Ext. [phone] Mondays & Thursdays: 7-9 AM.  First 15 patients are seen on a first come, first serve basis.    Medicaid-accepting [facility] Providers:  Organization         Address  Phone   Notes  [facility] [address], [place] [phone] Also accepts self-pay patients.  [facility] [address] Laurell Josephs [place], [place]  [phone]   [facility] [address], [place] [phone]   [facility] [address], [place] [phone]   Renaye Rakers [address], [place]   [phone] Only accepts [place] patients after they have their name applied to their card.   Self-Pay (no insurance) in [facility]:  Organization         Address  Phone   Notes  Sickle Cell Patients, [facility] [address] [place], [place] [phone]   [facility] [address] [place], [place] [phone]   [facility] [place]  [address], [place] [phone]   [facility]/Dr. Osei-Bonsu  [address], [place] or [address], [place] [phone] Phone number for both [place] and [place] locations is the same.  Urgent Medical and [facility] [address] [place] [phone]   [facility] [address] [place] or [address] [phone]   [facility] [address], [place] [phone], phone; [phone], fax Sees patients 1st and 3rd Saturday of every month.  Must not qualify for public or private insurance (i.e. Medicaid, Medicare, [place] Health Choice, Veterans' Benefits)  Household income should be no more than 200% of the poverty level The clinic cannot  treat you if you are pregnant or think you are pregnant  Sexually transmitted diseases are not treated at the clinic.    Dental Care: Organization         Address  Phone  Notes  [facility] of [facility] [address] Luis M. [place], [place] [phone] Accepts children up to age 19 who are enrolled in [place] or [place] Health Choice; pregnant women with a Medicaid card; and children who have applied for Medicaid or [place] Health Choice, but were declined, whose parents can pay a reduced fee at time of service.  [facility]  [address], [place] [phone] Accepts children up to age 66 who are enrolled in [place] or  Health Choice; pregnant women with a Medicaid card; and  children who have applied for Medicaid or [place] Health Choice, but were declined, whose parents can pay a reduced fee at time of service.  [facility]  [address] [place], [place] [phone] Patients are seen by appointment only. Walk-ins are not accepted. [facility] will see patients 45 years of age and older. Monday - Tuesday (8am-5pm) Most Wednesdays (8:30-5pm) $30 per visit, cash only  [facility] Adult Dental Access PRO[facility]  [address], [facility] [phone] Patients are seen by appointment only. Walk-ins are not accepted. [facility] will see patients 39 years of age and older. One Wednesday Evening (Monthly: Volunteer Based).  $30 per visit, cash only  [employer]  [phone] for adults; Children under age 2, call Graduate Pediatric Dentistry at [phone]. Children aged 52-14, please call [phone] to request a pediatric application.  Dental services are provided in all areas of dental care including fillings, crowns and bridges, complete and partial dentures, implants, gum treatment, root canals, and extractions. Preventive care is also provided.  Treatment is provided to both adults and children. Patients are selected via a lottery and there is often a waiting list.   [facility] [address] [place]  [phone] [email or web address]   [facility] [address] [place], [place] [phone], Ext. [phone] Second and Fourth Thursday of each month, opens at 6:30 AM; Clinic ends at 9 AM.  Patients are seen on a first-come first-served basis, and a limited number are seen during each clinic.   [facility]  [address] Ether Griffins [place], [place] [phone]   Eligibility Requirements You must have lived in [place], [place], or [place] counties for at least the last three months.   You cannot be eligible for state or federal sponsored [place], including [employer], [place], or [employer].   You generally cannot be eligible for healthcare insurance through your employer.    How to apply: Eligibility screenings are held every Tuesday and Wednesday afternoon from 1:00 pm until 4:00 pm. You do not need an appointment for the interview!  [facility] [address], [place], [place] [phone]   [facility] Health Department  [phone]   [facility] Health Department  [phone]   [facility]  [phone]    Behavioral Health Resources in the Community: Intensive Outpatient Programs Organization         Address  Phone  Notes  [facility] [address] [place], [place] [phone]   [facility] [address] [place], [place] [phone]   [facility] [address], [place], [place]  [phone]   [facility] Mental Health [address]  [place], [place] [phone] or [phone]   Substance Abuse Resources Organization         Address  Phone  Notes  [facility] Drug [facility]  [phone]   [facility]   [phone]   [facility] [place]  [phone]   Floydene Flock  [phone]   Residential & Outpatient Substance Abuse Program  [phone]   Psychological Services Organization         Address  Phone  Notes  [facility]  [phone]   [facility]  [phone]   [facility] [address] [place] [phone] or [phone]    Mobile Crisis Teams Organization  Address  Phone  Notes  [facility], Mobile Crisis Care Unit  [phone]   [facility]  [address] [place], [place] [phone]   [facility] [address] [place] [phone]    Self-Help/Support Groups Organization         Address  Phone             Notes  [facility] [place] - variety of support groups  336- [phone] Call for more information  Narcotics Anonymous (NA), [facility] [address], [place]  2 meetings at this location   Statisticianesidential Treatment Programs Organization         Address  Phone  Notes  [facility] [address],    [place]  [phone]   [facility]  [address], [place] [phone], [place], [place] [phone]   [facility] [address], [place] [phone] Admissions: 8am-3pm M-F  [facility] [address]    [place], [place] [phone]   [facility] [address], [place], [place] [phone]   [facility] [address]  [place], [place] [phone]   [facility] - Intensive Outpatient [address], [place], [place] [phone]   [facility] ([facility] [address]  [place], [place] [phone] or [phone]   [facility] ([facility]) [address] [place], [place] [phone] Accepts Medicaid  [facility] [address]  [place] [phone] Substance  Abuse/Addiction Treatment   [facility]al Health Resources Organization         Address  Phone  Notes  [facility]  [phone](888) [phone]   Angie FavaJulie Brannon, PhD [address] [place], [place]   [phone](336) [phone] or [phone]) [phone]   [facility]   [address] [place], [place] [phone](336) [phone]   [facility] [address], [place], [place] [phone](336) [phone] Insurance/Medicaid/sponsorship through [facility]  [facility] [address]                                    [place], [place] [phone]) [phone] Therapy/tele-psych/case  [facility] [address]   [place], [place] [phone]) [phone]    Dr. Lolly MustacheArfeen  [phone]) [phone]   [facility] [place]  [employer] [facility] 1) [address], [place] 2) [address], [place] 3)  [address], [place] [phone]) [phone]  [phone](336) [phone]   [facility] Child Abuse Hotline ([phone](336) [phone] or [phone](336) [phone] (After Hours)

## 2016-03-02 ENCOUNTER — Emergency Department (HOSPITAL_COMMUNITY): Payer: Self-pay

## 2016-03-02 ENCOUNTER — Emergency Department (HOSPITAL_COMMUNITY)
Admission: EM | Admit: 2016-03-02 | Discharge: 2016-03-03 | Disposition: A | Discharge status: Home / Self Care | Payer: Self-pay | Attending: Emergency Medicine | Admitting: Emergency Medicine

## 2016-03-02 ENCOUNTER — Encounter (HOSPITAL_COMMUNITY): Payer: Self-pay | Admitting: Emergency Medicine

## 2016-03-02 DIAGNOSIS — R410 Disorientation, unspecified: Secondary | ICD-10-CM

## 2016-03-02 DIAGNOSIS — R51 Headache: Secondary | ICD-10-CM | POA: Insufficient documentation

## 2016-03-02 DIAGNOSIS — R200 Anesthesia of skin: Secondary | ICD-10-CM | POA: Insufficient documentation

## 2016-03-02 DIAGNOSIS — R1013 Epigastric pain: Secondary | ICD-10-CM | POA: Insufficient documentation

## 2016-03-02 DIAGNOSIS — I10 Essential (primary) hypertension: Secondary | ICD-10-CM | POA: Insufficient documentation

## 2016-03-02 DIAGNOSIS — R42 Dizziness and giddiness: Secondary | ICD-10-CM | POA: Insufficient documentation

## 2016-03-02 DIAGNOSIS — R000 Tachycardia, unspecified: Secondary | ICD-10-CM | POA: Insufficient documentation

## 2016-03-02 DIAGNOSIS — F17200 Nicotine dependence, unspecified, uncomplicated: Secondary | ICD-10-CM | POA: Insufficient documentation

## 2016-03-02 DIAGNOSIS — R519 Headache, unspecified: Secondary | ICD-10-CM

## 2016-03-02 LAB — CBG MONITORING, ED: Glucose-Capillary: 88 mg/dL (ref 65–99)

## 2016-03-02 LAB — CBC
HCT: 45.3 % (ref 39.0–52.0)
HEMOGLOBIN: 16.3 g/dL (ref 13.0–17.0)
MCH: 30.5 pg (ref 26.0–34.0)
MCHC: 36 g/dL (ref 30.0–36.0)
MCV: 84.8 fL (ref 78.0–100.0)
Platelets: 461 10*3/uL — ABNORMAL HIGH (ref 150–400)
RBC: 5.34 MIL/uL (ref 4.22–5.81)
RDW: 12.5 % (ref 11.5–15.5)
WBC: 16.2 10*3/uL — ABNORMAL HIGH (ref 4.0–10.5)

## 2016-03-02 LAB — COMPREHENSIVE METABOLIC PANEL
ALT: 103 U/L — ABNORMAL HIGH (ref 17–63)
ANION GAP: 11 (ref 5–15)
AST: 56 U/L — ABNORMAL HIGH (ref 15–41)
Albumin: 4.7 g/dL (ref 3.5–5.0)
Alkaline Phosphatase: 154 U/L — ABNORMAL HIGH (ref 38–126)
BUN: 15 mg/dL (ref 6–20)
CHLORIDE: 104 mmol/L (ref 101–111)
CO2: 23 mmol/L (ref 22–32)
Calcium: 9.3 mg/dL (ref 8.9–10.3)
Creatinine, Ser: 0.98 mg/dL (ref 0.61–1.24)
GFR calc Af Amer: >60 mL/min (ref >60)
Glucose, Bld: 106 mg/dL — ABNORMAL HIGH (ref 65–99)
POTASSIUM: 4.1 mmol/L (ref 3.5–5.1)
Sodium: 138 mmol/L (ref 135–145)
TOTAL PROTEIN: 9.1 g/dL — AB (ref 6.5–8.1)
Total Bilirubin: 0.5 mg/dL (ref 0.3–1.2)

## 2016-03-02 LAB — URINALYSIS, ROUTINE W REFLEX MICROSCOPIC
BILIRUBIN URINE: NEGATIVE
Glucose, UA: NEGATIVE mg/dL
Hgb urine dipstick: NEGATIVE
Ketones, ur: NEGATIVE mg/dL
LEUKOCYTES UA: NEGATIVE
NITRITE: NEGATIVE
PH: 6 (ref 5.0–8.0)
Protein, ur: NEGATIVE mg/dL
SPECIFIC GRAVITY, URINE: 1.026 (ref 1.005–1.030)

## 2016-03-02 LAB — DIFFERENTIAL
BASOS ABS: 0.1 10*3/uL (ref 0.0–0.1)
BASOS PCT: 0 %
EOS ABS: 0.1 10*3/uL (ref 0.0–0.7)
EOS PCT: 1 %
LYMPHS ABS: 3.8 10*3/uL (ref 0.7–4.0)
Lymphocytes Relative: 24 %
MONOS PCT: 7 %
Monocytes Absolute: 1.1 10*3/uL — ABNORMAL HIGH (ref 0.1–1.0)
Neutro Abs: 11.2 10*3/uL — ABNORMAL HIGH (ref 1.7–7.7)
Neutrophils Relative %: 68 %

## 2016-03-02 LAB — I-STAT CHEM 8, ED
BUN: 15 mg/dL (ref 6–20)
Calcium, Ion: 1.16 mmol/L (ref 1.12–1.23)
Chloride: 105 mmol/L (ref 101–111)
Creatinine, Ser: 0.9 mg/dL (ref 0.61–1.24)
Glucose, Bld: 107 mg/dL — ABNORMAL HIGH (ref 65–99)
HEMATOCRIT: 51 % (ref 39.0–52.0)
Hemoglobin: 17.3 g/dL — ABNORMAL HIGH (ref 13.0–17.0)
Potassium: 4.6 mmol/L (ref 3.5–5.1)
SODIUM: 141 mmol/L (ref 135–145)
TCO2: 24 mmol/L (ref 0–100)

## 2016-03-02 LAB — I-STAT TROPONIN, ED: TROPONIN I, POC: 0 ng/mL (ref 0.00–0.08)

## 2016-03-02 LAB — APTT: APTT: 32 s (ref 24–37)

## 2016-03-02 LAB — PROTIME-INR
INR: 1.05 (ref 0.00–1.49)
Prothrombin Time: 13.9 seconds (ref 11.6–15.2)

## 2016-03-02 LAB — LIPASE, BLOOD: Lipase: 25 U/L (ref 11–51)

## 2016-03-02 MED ORDER — KETOROLAC TROMETHAMINE 30 MG/ML IJ SOLN
30.0000 mg | Freq: Once | INTRAMUSCULAR | Status: AC
  Administered 2016-03-02: 30 mg via INTRAVENOUS
  Filled 2016-03-02: qty 1

## 2016-03-02 MED ORDER — SODIUM CHLORIDE 0.9 % IV BOLUS (SEPSIS)
1000.0000 mL | Freq: Once | INTRAVENOUS | Status: AC
  Administered 2016-03-02: 1000 mL via INTRAVENOUS

## 2016-03-02 MED ORDER — ACETAMINOPHEN 325 MG PO TABS
650.0000 mg | ORAL_TABLET | Freq: Once | ORAL | Status: DC
  Filled 2016-03-02: qty 2

## 2016-03-02 MED ORDER — ACETAMINOPHEN 500 MG PO TABS
1000.0000 mg | ORAL_TABLET | Freq: Once | ORAL | Status: AC
  Administered 2016-03-03: 1000 mg via ORAL
  Filled 2016-03-02: qty 2

## 2016-03-02 NOTE — ED Notes (Addendum)
Pt in radiology 

## 2016-03-02 NOTE — ED Notes (Signed)
Woke up with a headache, pain medication not helping. 3 hours later felt his right arm getting numb, felt light headed, and also c/o epigastric pain. Hx of HTN. No neuro deficits observed in triage, wife states he's prescribed high BP meds but haven't been taking them. Tachycardic at 130 in triage.

## 2016-03-02 NOTE — ED Provider Notes (Signed)
CSN: [id number]     Arrival date & time 03/02/16  1544 History   First MD Initiated Contact with Patient 03/02/16 1659     Chief Complaint  Patient presents with  . Dizziness  . Numbness  . Headache  . Abdominal Pain   Wife is at bedside and interprets  HPI  Mr. [name] is a 35 year old male with a past history hypertension presenting with headache, extremity numbness and abdominal pain. Patient reports waking this morning with a generalized, throbbing headache. He clarifies the headache did not wake him from sleep but that he woke up normally and noted that he had a headache. He described it as a mild headache and he was not initially concerned about it. He took ibuprofen and continued about his day. Ibuprofen did not improve his headache. He reports that approximately 3 hours later he felt his right arm becoming numb. He states that soon after his left arm became numb as well. He describes the sensation as cramping in his fingertips and like he has poor circulation in his entire arm. He states that this numbness sensation has been intermittent throughout the day and switches between both arms, left arm only and right arm only. Denies inciting factors. He also reports associated lightheadedness with the onset of arm numbness. The lightheadedness worsens on ambulation. Denies room spinning sensation. He is also complaining of epigastric pain. He describes it as a pressure. Denies associated nausea, vomiting or diarrhea. The abdominal pain is not aggravated by food and he states he has had a normal appetite today. He states that he had similar abdominal pain approximately 2 months ago and he sought care at an outside emergency department. He does not remember what he was diagnosed with but states that they told him he had high blood pressure at this visit and prescribed medications. He has not been taking these medications. Denies fevers, chills, neck pain, neck stiffness, syncope, URI symptoms, SOB,  chest pain, palpitations, extremity weakness or gait issues. Denies recent trauma to the neck. Patient has history of appendectomy.  Past Medical History  Diagnosis Date  . Hypertension    Past Surgical History  Procedure Laterality Date  . Ankle surgery    . Knee surgery    . Nose surgery    . Appendectomy     History reviewed. No pertinent family history. Social History  Substance Use Topics  . Smoking status: Current Some Day Smoker  . Smokeless tobacco: None  . Alcohol Use: Yes     Comment: sometimes    Review of Systems  All other systems reviewed and are negative.     Allergies  Review of patient's allergies indicates no known allergies.  Home Medications   Prior to Admission medications   Medication Sig Start Date End Date Taking? Authorizing Provider  ibuprofen (ADVIL,MOTRIN) 200 MG tablet Take 600 mg by mouth every 6 (six) hours as needed for headache.   Yes Historical Provider, MD   BP 136/82 mmHg  Pulse 91  Temp(Src) 98.5 F (36.9 C) (Oral)  Resp 21  SpO2 100% Physical Exam  Constitutional: He is oriented to person, place, and time. He appears well-developed and well-nourished. No distress.  Morbidly obese  HENT:  Head: Normocephalic and atraumatic.  Mouth/Throat: Oropharynx is clear and moist.  Eyes: Conjunctivae and EOM are normal. Pupils are equal, round, and reactive to light. Right eye exhibits no discharge. Left eye exhibits no discharge. No scleral icterus.  Neck: Normal range of motion. Neck supple.  No tenderness to palpation. Full range of motion intact. No nuchal rigidity  Cardiovascular: Regular rhythm, normal heart sounds and intact distal pulses.  Tachycardia present.   Pulmonary/Chest: Effort normal and breath sounds normal. No respiratory distress.  Abdominal: Soft. Bowel sounds are normal. He exhibits no distension. There is no tenderness.  Musculoskeletal: Normal range of motion.  Full range of motion of the bilateral upper  extremities intact. Joints are supple without tenderness, swelling or deformity.  Neurological: He is alert and oriented to person, place, and time. No cranial nerve deficit. Coordination normal.  Cranial nerves III through XII tested and intact. 5/5 strength in all major muscle groups. Sensation to light touch intact throughout. Able to differentiate sharp versus dull over bilateral upper extremities. No sensory deficits or change in perception of sensation when comparing left and right upper extremity. Coordinated finger to nose.  Skin: Skin is warm and dry.  Psychiatric: He has a normal mood and affect. His behavior is normal.  Nursing note and vitals reviewed.   ED Course  Procedures (including critical care time) Labs Review Labs Reviewed  CBC - Abnormal; Notable for the following:    WBC 16.2 (*)    Platelets 461 (*)    All other components within normal limits  DIFFERENTIAL - Abnormal; Notable for the following:    Neutro Abs 11.2 (*)    Monocytes Absolute 1.1 (*)    All other components within normal limits  COMPREHENSIVE METABOLIC PANEL - Abnormal; Notable for the following:    Glucose, Bld 106 (*)    Total Protein 9.1 (*)    AST 56 (*)    ALT 103 (*)    Alkaline Phosphatase 154 (*)    All other components within normal limits  I-STAT CHEM 8, ED - Abnormal; Notable for the following:    Glucose, Bld 107 (*)    Hemoglobin 17.3 (*)    All other components within normal limits  PROTIME-INR  APTT  LIPASE, BLOOD  URINALYSIS, ROUTINE W REFLEX MICROSCOPIC (NOT AT [facility])  I-STAT TROPOININ, ED  CBG MONITORING, ED    Imaging Review Dg Chest 2 View  03/02/2016  CLINICAL DATA:  Patient with headache.  Right arm numbness. EXAM: CHEST  2 VIEW COMPARISON:  Chest radiograph 01/06/2008. FINDINGS: Multiple monitoring leads overlie the patient. Normal cardiac and mediastinal contours. No consolidative pulmonary opacities. No pleural effusion or pneumothorax. IMPRESSION: No active  cardiopulmonary disease. Electronically Signed   By: Annia Beltrew  Davis M.D.   On: 03/02/2016 21:54   Ct Head Wo Contrast  03/02/2016  CLINICAL DATA:  Headache.  Right arm numbness.  Lightheaded. EXAM: CT HEAD WITHOUT CONTRAST TECHNIQUE: Contiguous axial images were obtained from the base of the skull through the vertex without intravenous contrast. COMPARISON:  01/06/2008 FINDINGS: Gray-white differentiation is maintained. No CT evidence of acute large territory infarct. No intraparenchymal or extra-axial mass or hemorrhage. 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 Otherwise, normal size and configuration of the ventricles and basilar cisterns. No midline shift. Limited visualization of the paranasal sinuses and mastoid air cells is normal. No air-fluid levels. Regional soft tissues appear normal. No displaced calvarial fracture. IMPRESSION: Negative noncontrast head CT. Electronically Signed   By: Simonne ComeJohn  Watts M.D.   On: 03/02/2016 17:50   Ct Cervical Spine Wo Contrast  03/02/2016  CLINICAL DATA:  Right arm numbness. EXAM: CT CERVICAL SPINE WITHOUT CONTRAST TECHNIQUE: Multidetector CT imaging of the cervical spine was performed without intravenous contrast. Multiplanar CT image  reconstructions were also generated. COMPARISON:  01/06/2008. FINDINGS: Normal appearing bones and soft tissues. No visible disc herniations. IMPRESSION: Normal examination. Electronically Signed   By: Beckie Salts M.D.   On: 03/02/2016 22:18   US Abdomen Limited  03/02/2016  CLINICAL DATA:  Patient with epigastric pain. EXAM: US ABDOMEN LIMITED - RIGHT UPPER QUADRANT COMPARISON:  CT abdomen pelvis 06/06/2009 FINDINGS: Gallbladder: No gallstones or wall thickening visualized. No sonographic Murphy sign noted by sonographer. Common bile duct: Diameter: 4 mm Liver: Diffusely increased in echogenicity.  No focal lesion identified. IMPRESSION: No cholelithiasis or sonographic evidence for acute cholecystitis. Hepatic steatosis.  Electronically Signed   By: Annia Belt M.D.   On: 03/02/2016 20:44   I have personally reviewed and evaluated these images and lab results as part of my medical decision-making.   EKG Interpretation None      MDM   Final diagnoses:  Arm numbness  Headache, unspecified headache type  Epigastric pain   35 year old male presenting with headache, bilateral arm numbness, lightheadedness and epigastric pain 1 day. Initially hypertensive to 168/115 in triage with mild tachycardia at 130. During stay in emergency department, blood pressure on average of 130/80 and heart rate in the 90s. Patient is nontoxic appearing. Nonfocal neuro exam. No sensory deficit of the bilateral upper extremities. Upper extremities are neurovascularly intact with range of motion. No tenderness of the neck. Heart regular rate and rhythm. Lungs clear to auscultation bilaterally. Abdomen is soft, nontender without peritoneal signs. Leukocytosis of 16. Mildly elevated AST, ALT and alkaline phosphatase. Troponin negative with nonischemic EKG. CT head negative. CT cervical spine negative. Ordered MRI but patient is too large for the machine. Abdominal ultrasound without evidence of cholecystitis. Chest x-ray without acute infiltrate. Urinalysis without signs of infection. Patient denies all infectious symptoms; leukocytosis without etiology. Symptoms improved with Toradol and Tylenol. Consult it neurology. Dr. Roseanne Reno does not recommend further evaluation and believes this is unlikely to be a neurological emergency. Patient reports improvement in symptoms prior to discharge. Pt remains normotensive while in ED. Encouraged pt to follow up with PCP for blood pressure control. Given referral information for neurology and pt is to call if symptoms do not resolve.  At this time there does not appear to be any evidence of an acute emergency medical condition and the patient appears stable for discharge with appropriate outpatient follow up.  Diagnosis was discussed with patient who verbalizes understanding and is agreeable to discharge. Pt case discussed with Dr. Clarene Duke who agrees with my plan. Return precautions given in discharge paperwork and discussed with pt at bedside. Pt is stable for discharge.     Alveta Heimlich, PA-C 03/03/16 0016  Douglas Jester, DO 03/04/16 1610

## 2016-03-02 NOTE — ED Notes (Signed)
Ultrasound just completed.  

## 2016-03-02 NOTE — ED Notes (Signed)
Patient transported to MRI 

## 2016-03-02 NOTE — Progress Notes (Addendum)
EDCM spoke to patient at bedside. Patient confirms he [place] a [place]cp [place]  [facility], informed patient of services there and walk in times.  EDCM also provided patient with list of pcps who accept self pay patients, list of discount pharmacies and websites needymeds.org and GoodRX.com for medication assistance, phone number to inquire about the orange card, phone number to inquire about Mediciad, phone number to inquire about the Affordable Care Act, financial resources in the community such as local churches, salvation army, urban ministries, and dental assistance for uninsured patients.  Patient thankful for resources.  No further EDCM needs at this time.  Resources reviewed with patient's fiance' at bedside.  Patient speaks spanish.

## 2016-03-02 NOTE — Discharge Instructions (Signed)
Schedule an appointment with a PCP for a visit to discuss your blood pressure and for follow up for today's visit. Take your blood pressure medication as prescribed. Continue using ibuprofen or tylenol for headaches. Stay well hydrated. Follow up with neurology as needed. Return to ED with new, worsening or concerning symptoms.    Dolor de cabeza general sin causa (General Headache Without Cause) El dolor de cabeza es un dolor o malestar que se siente en la zona de la cabeza o del cuello. Puede no tener una causa especfica. Hay muchas causas y tipos de dolores de Turkmenistancabeza. Los dolores de cabeza ms comunes son los siguientes:  Cefalea tensional.  Cefaleas migraosas.  Cefalea en brotes.  Cefaleas diarias crnicas. INSTRUCCIONES PARA EL CUIDADO EN EL HOGAR  Controle su afeccin para ver si hay cambios. Siga estos pasos para Scientist, physiologicalcontrolar la afeccin: Control del Reynolds Americandolor  Tome los medicamentos de venta libre y los recetados solamente como se lo haya indicado el mdico.  Cuando sienta dolor de cabeza acustese en un cuarto oscuro y tranquilo.  Si se lo indican, aplique hielo sobre la cabeza y la zona del cuello:  Ponga el hielo en una bolsa plstica.  Coloque una toalla entre la piel y la bolsa de hielo.  Coloque el hielo durante 20minutos, 2 a 3veces por Futures traderda.  Utilice una almohadilla trmica o tome una ducha con agua caliente para aplicar calor en la cabeza y la zona del cuello como se lo haya indicado el mdico.  Mantenga las luces tenues si le Liz Claibornemolesta las luces brillantes o sus dolores de cabeza empeoran. Comida y bebida  Mantenga un horario para las comidas.  Limite el consumo de bebidas alcohlicas.  Consuma menos cantidad de cafena o deje de tomarla. Instrucciones generales  Concurra a todas las visitas de control como se lo haya indicado el mdico. Esto es importante.  Lleve un diario de los dolores de cabeza para Financial risk analystaveriguar qu factores pueden desencadenarlos. Por ejemplo,  escriba los siguientes datos:  Lo que usted come y Estate agentbebe.  Cunto tiempo duerme.  Algn cambio en su dieta o en los medicamentos.  Pruebe algunas tcnicas de relajacin, como los Dri[place] Springsmasajes.  Limite el estrs.  Sintese con la espalda recta y no tense los msculos.  No consuma productos que contengan tabaco, incluidos cigarrillos, tabaco de Theatre managermascar o cigarrillos electrnicos. Si necesita ayuda para dejar de fumar, consulte al mdico.  Haga actividad fsica habitualmente como se lo haya indicado el mdico.  Tenga un horario fijo para dormir. Duerma entre 7 y 9horas o la cantidad de horas que le haya recomendado el mdico. SOLICITE ATENCIN MDICA SI:   Los medicamentos no Materials engineerlogran aliviar los sntomas.  Tiene un dolor de cabeza que es diferente del dolor de cabeza habitual.  Tiene nuseas o vmitos.  Tiene fiebre. SOLICITE ATENCIN MDICA DE INMEDIATO SI:   El dolor se hace cada vez ms intenso.  Ha vomitado repetidas veces.  Presenta rigidez en el cuello.  Sufre prdida de la visin.  Tiene problemas para hablar.  Siente dolor en el ojo o en el odo.  Presenta debilidad muscular o prdida del control muscular.  Pierde el equilibrio o tiene problemas para Advertising account plannercaminar.  Sufre mareos o se desmaya.  Se siente confundido.   Esta informacin no tiene Theme park managercomo fin reemplazar el consejo del mdico. Asegrese de hacerle al mdico cualquier pregunta que tenga.   Document Released: 08/05/2005 Document Revised: 07/17/2015 Elsevier Interactive Patient Education 2016 [employer]vier Inc.  Dolor neuroptico (  Neuropathic Pain) El dolor neuroptico se produce cuando hay dao en los nervios responsables de ciertas sensaciones del cuerpo (nervios sensitivos). El dolor puede producirse por un dao en las siguientes reas:   Los nervios sensitivos que envan seales a la mdula espinal y el cerebro (sistema nervioso perifrico).  Los nervios sensitivos del cerebro o la mdula espinal (sistema  nervioso central). El dolor neuroptico puede volverlo ms sensible al [employer]ck & Co. Lo que sera una sensacin leve para la [employer] puede ser un dolor muy intenso, si sufre dolor neuroptico. Por lo general, es una enfermedad a largo plazo que puede ser difcil de Warehouse manager. El tipo de dolor puede diferir de Neomia Dear persona a Educational psychologist. Puede comenzar en forma repentina (agudo) o puede desarrollarse lentamente y durar mucho tiempo (crnico). El dolor neuroptico puede aparecer y Geneticist, molecular a medida que los nervios daados se curan, o puede permanecer estables durante aos. A menudo, provoca angustia, prdida de sueo y [place] calidad de vida inferior. CAUSAS  La causa ms comn del dao en los nervios sensitivos es la diabetes. El dolor neuroptico tambin puede producirse por muchas otras enfermedades y afecciones. Las causas del dolor neuroptico pueden clasificarse como:  Txicas. Muchos medicamentos y sustancias qumicas pueden provocar dao txico. La causa ms comn del dolor neuroptico por exposicin a sustancias txicas es el dao que produce el tratamiento Programmer, applications (quimioterapia).  Metablicas. Este tipo de dolor puede aparecer cuando una enfermedad provoca desequilibrios que daan los nervios. La diabetes es la ms comn de [employer]. Otra causa comn es la deficiencia de vitamina B causada por el consumo de alcohol prolongado.  Traumticas. Las lesiones que cortan, presionan o estiran un nervio pueden producir dao y Engineer, mining. Un ejemplo comn es sentir dolor despus de perder un brazo o una pierna (dolor de miembro fantasma).  Causas relacionadas con la compresin. Si un nervio sensitivo queda atrapado o comprimido por [employer], el suministro de sangre al nervio puede interrumpirse.  Vascular. Muchas enfermedades de los vasos sanguneos pueden producir dolor neuroptico al reducir el suministro de [place] y el oxgeno que Zenaida Niece a los nervios.  Autoinmune. Este  tipo de Engineer, mining se produce por las Alber[employer]'s el sistema de defensa del cuerpo ataca por error los nervios sensitivos. Entre los ejemplos de enfermedades autoinmunes que pueden causar dolor neuroptico se incluyen el lupus y Chartered loss adjuster.  Infecciosa. Muchos tipos de infecciones virales pueden daar los nervios sensitivos y Programmer, multimedia. La infeccin por culebrilla (virus del herpes zster) es una causa comn de este tipo de Engineer, mining.  Hereditarias. El dolor neuroptico puede ser un sntoma de muchas enfermedades que se transmiten entre los miembros de las familias (genticas). SIGNOS Y SNTOMAS  El sntoma principal es Chief Technology Officer. El dolor neuroptico a menudo se describe como:  Quemaduras.  Similar a un choque.  Escozor.  Fro o calor.  Picazn. DIAGNSTICO  No hay un estudio que pueda diagnosticar el dolor neuroptico. El mdico le har un examen fsico y le preguntar sobre su dolor. Puede usar una escala de dolor para describir la intensidad del dolor. Tambin puede realizarle pruebas para corroborar su sensibilidad al Merck & Co, y ayudar a Clinical research associate la causa y la ubicacin de los daos en los nervios sensitivos. Estos estudios pueden incluir los siguientes:  Estudios de diagnstico por imgenes, por ejemplo:  Radiografas.  Tomografa computarizada.  Resonancia magntica.  Estudios de conduccin nerviosa para evaluar si las seales nerviosas pasan por los nervios  sensitivos en forma correcta o no (estudios electrodiagnsticos).  Estimulacin de los nervios sensitivos a travs de electrodos que se colocan en la piel, y la medicin de la respuesta en la mdula espinal y el cerebro (potenciales provocados somatosensitivos). TRATAMIENTO  El tratamiento para el dolor neuroptico puede cambiar con el [place]. Es posible que necesite probar distintas opciones de tratamientos o una combinacin de tratamientos. Entre las opciones se incluyen las siguientes:  Analgsicos de  [place].  Medicamentos recetados. Algunos medicamentos que se utilizan para tratar otras enfermedades tambin pueden ser [place] para el dolor neuroptico. Entre ellos se incluyen los medicamentos para:  Chief Operating Officer las convulsiones (anticonvulsivos).  Aliviar la depresin (antidepresivos).  Analgsicos que requieren receta mdica (narcticos). Por lo general, estos medicamentos se utilizan cuando otros analgsicos no son eficaces.  Neuroestimulacin transcutnea (TENS). En este tratamiento se utilizan corrientes elctricas para bloquear las seales nerviosas dolorosas. El tratamiento es indoloro.  Anestsicos tpicos y locales. Estos son medicamentos que Corning Incorporated nervios. Pueden inyectarse como bloqueos nerviosos o aplicarse en la piel.  Tratamientos alternativos, como:  Acupuntura.  Meditacin.  Masajes.  Fisioterapia.  Programas para el control del dolor.  Psicoterapia. INSTRUCCIONES PARA EL CUIDADO EN EL HOGAR  Infrmese todo lo que pueda sobre su enfermedad.  Tome los medicamentos solamente como se lo haya indicado el mdico.  Trabaje en estrecha colaboracin con todos los mdicos para hallar el tratamiento ms adecuado para usted.  Tenga un buen sistema de Immunologist.  Considere la opcin de unirse a un grupo de apoyo para el dolor crnico. SOLICITE ATENCIN MDICA SI:  Sus tratamientos para Chief Technology Officer no son eficaces.  Los [employer]ys [employer] Us causan [employer] Corporation.  Est lidiando con sntomas de fatiga, cambios en el estado de nimo, depresin o ansiedad.   Esta informacin no tiene Theme park manager el consejo del mdico. Asegrese de hacerle al mdico cualquier pregunta que tenga.   Document Released: 02/02/2008 Document Revised: 11/16/2014 Elsevier Interactive Patient Education 2016 ArvinMeritor.  Dolor abdominal en adultos (Abdominal Pain, Adult) El dolor puede tener muchas causas. Normalmente la causa del dolor abdominal no es una  enfermedad y Scientist, clinical (histocompatibility and immunogenetics) sin TEFL teacher. Frecuentemente puede controlarse y tratarse en casa. Su mdico le Medical sales representative examen fsico y posiblemente solicite anlisis de sangre y radiografas para ayudar a Chief Strategy Officer la gravedad de su dolor. Sin embargo, en [employer], debe transcurrir ms tiempo antes de que se pueda Clinical research associate una causa evidente del dolor. Antes de llegar a ese punto, es posible que su mdico no sepa si necesita ms pruebas o un tratamiento ms profundo. INSTRUCCIONES PARA EL CUIDADO EN EL HOGAR  Est atento al dolor para ver si hay cambios. Las siguientes indicaciones ayudarn a Architectural technologist que pueda sentir:  [place] solo medicamentos de venta libre o recetados, segn las indicaciones del mdico.  No tome laxantes a menos que se lo haya indicado su mdico.  Pruebe con Neomia Dear dieta lquida absoluta (caldo, t o agua) segn se lo indique su mdico. Introduzca gradualmente una dieta normal, segn su tolerancia. SOLICITE ATENCIN MDICA SI:  Tiene dolor abdominal sin explicacin.  Tiene dolor abdominal relacionado con nuseas o diarrea.  Tiene dolor cuando orina o defeca.  Experimenta dolor abdominal que lo despierta de noche.  Tiene dolor abdominal que empeora o mejora cuando come alimentos.  Tiene dolor abdominal que empeora cuando come alimentos grasosos.  Tiene fiebre. SOLICITE ATENCIN MDICA DE INMEDIATO SI:   El dolor no desaparece en un plazo  mximo de 2horas.  No deja de (vomitar).  El Engineer, mining se siente solo en partes del abdomen, como el lado derecho o la parte inferior izquierda del abdomen.  Evaca materia fecal sanguinolenta o negra, de aspecto alquitranado. ASEGRESE DE QUE:  Comprende estas instrucciones.  Controlar su afeccin.  Recibir ayuda de inmediato si no mejora o si empeora.   Esta informacin no tiene Theme park manager el consejo del mdico. Asegrese de hacerle al mdico cualquier pregunta que tenga.   Document Released:  10/26/2005 Document Revised: 11/16/2014 Elsevier Interactive Patient Education [employer].

## 2016-03-02 NOTE — ED Notes (Signed)
Pt returned from MRI. Pt is to large in the shoulders to fit in the MRI machine. Rolm GalaStevi, PA is aware.

## 2016-03-02 NOTE — ED Notes (Signed)
Pt is in MRI  

## 2016-03-03 ENCOUNTER — Encounter (HOSPITAL_COMMUNITY): Payer: Self-pay | Admitting: Emergency Medicine

## 2016-04-15 ENCOUNTER — Emergency Department (HOSPITAL_COMMUNITY): Payer: Self-pay

## 2016-04-15 ENCOUNTER — Encounter (HOSPITAL_COMMUNITY): Payer: Self-pay | Admitting: Emergency Medicine

## 2016-04-15 ENCOUNTER — Emergency Department (HOSPITAL_COMMUNITY)
Admission: EM | Admit: 2016-04-15 | Discharge: 2016-04-16 | Disposition: A | Discharge status: Home / Self Care | Payer: Self-pay | Attending: Emergency Medicine | Admitting: Emergency Medicine

## 2016-04-15 DIAGNOSIS — T59891A Toxic effect of other specified gases, fumes and vapors, accidental (unintentional), initial encounter: Secondary | ICD-10-CM

## 2016-04-15 DIAGNOSIS — J45901 Unspecified asthma with (acute) exacerbation: Secondary | ICD-10-CM | POA: Insufficient documentation

## 2016-04-15 DIAGNOSIS — R0602 Shortness of breath: Secondary | ICD-10-CM | POA: Insufficient documentation

## 2016-04-15 DIAGNOSIS — F17210 Nicotine dependence, cigarettes, uncomplicated: Secondary | ICD-10-CM | POA: Insufficient documentation

## 2016-04-15 DIAGNOSIS — Z791 Long term (current) use of non-steroidal anti-inflammatories (NSAID): Secondary | ICD-10-CM | POA: Insufficient documentation

## 2016-04-15 DIAGNOSIS — Z792 Long term (current) use of antibiotics: Secondary | ICD-10-CM | POA: Insufficient documentation

## 2016-04-15 DIAGNOSIS — R110 Nausea: Secondary | ICD-10-CM | POA: Insufficient documentation

## 2016-04-15 DIAGNOSIS — J189 Pneumonia, unspecified organism: Secondary | ICD-10-CM | POA: Insufficient documentation

## 2016-04-15 DIAGNOSIS — I10 Essential (primary) hypertension: Secondary | ICD-10-CM | POA: Insufficient documentation

## 2016-04-15 DIAGNOSIS — Z79899 Other long term (current) drug therapy: Secondary | ICD-10-CM | POA: Insufficient documentation

## 2016-04-15 DIAGNOSIS — T5491XA Toxic effect of unspecified corrosive substance, accidental (unintentional), initial encounter: Secondary | ICD-10-CM | POA: Insufficient documentation

## 2016-04-15 LAB — CBC
HCT: 40.8 % (ref 39.0–52.0)
Hemoglobin: 14.3 g/dL (ref 13.0–17.0)
MCH: 29.9 pg (ref 26.0–34.0)
MCHC: 35 g/dL (ref 30.0–36.0)
MCV: 85.2 fL (ref 78.0–100.0)
PLATELETS: 410 10*3/uL — AB (ref 150–400)
RBC: 4.79 MIL/uL (ref 4.22–5.81)
RDW: 12.6 % (ref 11.5–15.5)
WBC: 15 10*3/uL — AB (ref 4.0–10.5)

## 2016-04-15 LAB — COMPREHENSIVE METABOLIC PANEL
ALT: 79 U/L — AB (ref 17–63)
AST: 37 U/L (ref 15–41)
Albumin: 4 g/dL (ref 3.5–5.0)
Alkaline Phosphatase: 146 U/L — ABNORMAL HIGH (ref 38–126)
Anion gap: 7 (ref 5–15)
BILIRUBIN TOTAL: 0.5 mg/dL (ref 0.3–1.2)
BUN: 14 mg/dL (ref 6–20)
CO2: 24 mmol/L (ref 22–32)
CREATININE: 0.62 mg/dL (ref 0.61–1.24)
Calcium: 9 mg/dL (ref 8.9–10.3)
Chloride: 105 mmol/L (ref 101–111)
GFR calc Af Amer: >60 mL/min (ref >60)
Glucose, Bld: 121 mg/dL — ABNORMAL HIGH (ref 65–99)
POTASSIUM: 3.8 mmol/L (ref 3.5–5.1)
Sodium: 136 mmol/L (ref 135–145)
TOTAL PROTEIN: 7.8 g/dL (ref 6.5–8.1)

## 2016-04-15 LAB — I-STAT TROPONIN, ED: Troponin i, poc: 0 ng/mL (ref 0.00–0.08)

## 2016-04-15 LAB — LIPASE, BLOOD: LIPASE: 33 U/L (ref 11–51)

## 2016-04-15 MED ORDER — METHYLPREDNISOLONE SODIUM SUCC 125 MG IJ SOLR
125.0000 mg | Freq: Once | INTRAMUSCULAR | Status: AC
  Administered 2016-04-16: 125 mg via INTRAVENOUS
  Filled 2016-04-15: qty 2

## 2016-04-15 MED ORDER — ALBUTEROL SULFATE (2.5 MG/3ML) 0.083% IN NEBU
2.5000 mg | INHALATION_SOLUTION | RESPIRATORY_TRACT | Status: DC | PRN
  Administered 2016-04-16: 2.5 mg via RESPIRATORY_TRACT
  Filled 2016-04-15: qty 3

## 2016-04-15 MED ORDER — LORAZEPAM 2 MG/ML IJ SOLN
1.0000 mg | INTRAMUSCULAR | Status: DC | PRN
  Administered 2016-04-16: 1 mg via INTRAVENOUS
  Filled 2016-04-15: qty 1

## 2016-04-15 MED ORDER — ONDANSETRON HCL 4 MG/2ML IJ SOLN
4.0000 mg | Freq: Once | INTRAMUSCULAR | Status: AC
  Administered 2016-04-16: 4 mg via INTRAVENOUS
  Filled 2016-04-15: qty 2

## 2016-04-15 NOTE — ED Notes (Signed)
PT spouse reports that pt has been cleaning a room with bleach for the last day and it was not ventilated. After doing the cleaning pt reporting shortness of breath with cough and chest pain also burning of the eyes.

## 2016-04-15 NOTE — ED Notes (Signed)
Patient presents for SOB, dizziness, posterior neck pain, non productive cough, N/D, bilateral eye "burning" x1 day.

## 2016-04-16 LAB — URINALYSIS, ROUTINE W REFLEX MICROSCOPIC
Bilirubin Urine: NEGATIVE
GLUCOSE, UA: NEGATIVE mg/dL
Ketones, ur: NEGATIVE mg/dL
LEUKOCYTES UA: NEGATIVE
Nitrite: NEGATIVE
PH: 6 (ref 5.0–8.0)
PROTEIN: NEGATIVE mg/dL
SPECIFIC GRAVITY, URINE: 1.016 (ref 1.005–1.030)

## 2016-04-16 LAB — URINE MICROSCOPIC-ADD ON
BACTERIA UA: NONE SEEN
RBC / HPF: NONE SEEN RBC/hpf (ref 0–5)
WBC, UA: NONE SEEN WBC/hpf (ref 0–5)

## 2016-04-16 MED ORDER — PREDNISONE 20 MG PO TABS: 20.0000 mg | ORAL_TABLET | Freq: Two times a day (BID) | ORAL | Status: DC

## 2016-04-16 MED ORDER — BENZONATATE 100 MG PO CAPS
200.0000 mg | ORAL_CAPSULE | Freq: Once | ORAL | Status: AC
  Administered 2016-04-16: 200 mg via ORAL
  Filled 2016-04-16: qty 2

## 2016-04-16 MED ORDER — ALBUTEROL SULFATE HFA 108 (90 BASE) MCG/ACT IN AERS: 1.0000 | INHALATION_SPRAY | Freq: Four times a day (QID) | RESPIRATORY_TRACT | Status: DC | PRN

## 2016-04-16 MED ORDER — BENZONATATE 100 MG PO CAPS: 100.0000 mg | ORAL_CAPSULE | Freq: Three times a day (TID) | ORAL | Status: DC

## 2016-04-16 NOTE — ED Notes (Signed)
Pt family reports understanding of discharge information. No questions at time of discharge 

## 2016-04-16 NOTE — Discharge Instructions (Signed)
Lesin por la inhalacin de sustancias qumicas (Chemical Inhalation Injury) Una lesin por la inhalacin de sustancias qumicas es una lesin interna, por ejemplo, dao pulmonar, que se produce al International Business Machinesinhalar los gases de una sustancia qumica o daina (sustancia txica). Las lesiones por la inhalacin de sustancias qumicas ocurren con mayor frecuencia en estos casos:  Durante los incendios, cuando los materiales que se queman liberan sustancias qumicas en el [place].  Durante los accidentes laborales, cuando se derraman grandes cantidades de sustancias qumicas txicas en las fbricas o las industrias. Las lesiones por la inhalacin de sustancias qumicas son de diferente gravedad. Una lesin suele ser ms grave en los siguientes casos:  Cuanto ms cida o alcalina es la sustancia qumica.  Cuanto ms concentrada es la sustancia.  Cuanto ms tiempo se est expuesto a la sustancia. FACTORES DE RIESGO Tiene un riesgo alto de sufrir una lesin por la inhalacin de sustancias qumicas si:  Est expuesto a materiales que estn ardiendo.  Trabaja con sustancias qumicas, solventes o agentes de limpieza. SIGNOS Y SNTOMAS Los sntomas de una lesin por la inhalacin de sustancias qumicas pueden incluir lo siguiente:  Voz ronca.  Falta de aire o dificultad para respirar.  Dolor en el pecho.  Piel plida o de color azulado.  Produccin de mucosidad.  Tos.  Debilidad.  Mareos o Newell Rubbermaiddesmayos. DIAGNSTICO La mayora de las lesiones por la inhalacin de sustancias txicas se puede diagnosticar con un examen fsico y Neomia Dearuna historia clnica. Se pueden hacer estudios para comprobar si hay dao pulmonar. Estos pueden incluir los siguientes:  Un anlisis del nivel de oxgeno en la [place].  Radiografa de trax.  Pruebas de la funcin pulmonar. No hay pruebas que permitan identificar la sustancia qumica especfica que caus la lesin. TRATAMIENTO  No hay un tratamiento especfico para una  lesin por la inhalacin de sustancias qumicas. La mayora de los tratamientos estn dirigidos a Scientist, clinical (histocompatibility and immunogenetics)mejorar la capacidad de los pulmones de llevar oxgeno al organismo. Se necesita tiempo para que el tejido pulmonar cicatrice. El tratamiento complementario puede incluir lo siguiente:  Tratamientos con Unisys Corporationaerosoles para reducir la hinchazn de las vas respiratorias.  Succin de las vas respiratorias para retirar el exceso de mucosidad.  Oxgeno complementario. INSTRUCCIONES PARA EL CUIDADO EN EL HOGAR  No consuma ningn producto que contenga tabaco, lo que incluye cigarrillos, tabaco de Theatre managermascar o Administrator, Civil Servicecigarrillos electrnicos. Si necesita ayuda para dejar de fumar, consulte al mdico.  No se exponga a ningn producto que irrite las vas respiratorias, como el humo del cigarrillo o el humo de una chimenea.  Siga las indicaciones de su mdico en cuanto al uso de Conservation officer, naturecualquier inhalador.  Tome los medicamentos solamente como se lo haya indicado el mdico.  Concurra a todas las visitas de control como se lo haya indicado el mdico. Esto es importante. SOLICITE ATENCIN MDICA SI:  Los sntomas no mejoran como el mdico predijo. SOLICITE ATENCIN MDICA DE INMEDIATO SI:  Los sntomas empeoran.  Siente que aumentan la falta de aire o las sibilancias.  La piel o los labios estn muy plidos o de color azul.  Tiene tos persistente.  Tose con [place] o expectora un material oscuro.  Tiene dolor de pecho o debilidad.  Tiene fiebre.  Se desmaya.   Esta informacin no tiene Theme park managercomo fin reemplazar el consejo del mdico. Asegrese de hacerle al mdico cualquier pregunta que tenga.   Document Released: 08/10/2014 Elsevier Interactive Patient Education [employer]2016 Elsevier Inc.

## 2016-04-16 NOTE — ED Notes (Signed)
Pt reporting increasing shortness of breath and cough along with anxiety on evaluation. PRN medications to be administered. Pt given urinal and instructed on need for urine sample

## 2016-04-16 NOTE — ED Provider Notes (Signed)
CSN: [id number]     Arrival date & time 04/15/16  2217 History   First MD Initiated Contact with Patient 04/15/16 2342     Chief Complaint  Patient presents with  . Shortness of Breath  . Cough  . Nausea      HPI Patient without a history of asthma presents with coughing and wheezing after using bleach in the home for the last 2 days. Wife states that they were treating for bedbugs in the home. He has been coughing and short of breath most today. She states she is asthmatic and she became concerned because he looked distress and they present here.   Past Medical History  Diagnosis Date  . Obesity   . Hypertension    Past Surgical History  Procedure Laterality Date  . Ankle surgery    . Knee surgery    . Nose surgery    . Appendectomy     No family history on file. Social History  Substance Use Topics  . Smoking status: Current Some Day Smoker -- 0.10 packs/day    Types: Cigarettes  . Smokeless tobacco: None  . Alcohol Use: Yes     Comment: sometimes    Review of Systems  Constitutional: Negative for fever, chills, diaphoresis, appetite change and fatigue.  HENT: Negative for mouth sores, sore throat and trouble swallowing.   Eyes: Negative for visual disturbance.  Respiratory: Positive for shortness of breath. Negative for cough, chest tightness and wheezing.   Cardiovascular: Negative for chest pain.  Gastrointestinal: Negative for nausea, vomiting, abdominal pain, diarrhea and abdominal distention.  Endocrine: Negative for polydipsia, polyphagia and polyuria.  Genitourinary: Negative for dysuria, frequency and hematuria.  Musculoskeletal: Negative for gait problem.  Skin: Negative for color change, pallor and rash.  Neurological: Negative for dizziness, syncope, light-headedness and headaches.  Hematological: Does not bruise/bleed easily.  Psychiatric/Behavioral: Negative for behavioral problems and confusion. The patient is nervous/anxious.       Allergies   Review of patient's allergies indicates no known allergies.  Home Medications   Prior to Admission medications   Medication Sig Start Date End Date Taking? Authorizing Provider  acetaminophen (TYLENOL) 500 MG tablet Take 1,000 mg by mouth every 6 (six) hours as needed (for headache).   Yes Historical Provider, MD  ibuprofen (ADVIL,MOTRIN) 200 MG tablet Take 600 mg by mouth every 6 (six) hours as needed for headache.   Yes Historical Provider, MD  amoxicillin (AMOXIL) 500 MG capsule Take 1 capsule (500 mg total) by mouth 3 (three) times daily. Patient not taking: Reported on 04/15/2016 11/06/14   Harle Battiest, NP  antipyrine-benzocaine Lyla Son) otic solution Place 3-4 drops into the left ear every 2 (two) hours as needed for ear pain. Patient not taking: Reported on 04/15/2016 11/06/14   Harle Battiest, NP  naproxen (NAPROSYN) 500 MG tablet Take 1 tablet (500 mg total) by mouth 2 (two) times daily with a meal. Patient not taking: Reported on 11/06/2014 11/07/13   [name], MD  naproxen (NAPROSYN) 500 MG tablet Take 1 tablet (500 mg total) by mouth 2 (two) times daily. Patient not taking: Reported on 04/15/2016 11/06/14   Harle Battiest, NP  oseltamivir (TAMIFLU) 75 MG capsule Take 1 capsule (75 mg total) by mouth every 12 (twelve) hours. Patient not taking: Reported on 11/06/2014 11/07/13   [name], MD   BP 138/88 mmHg  Pulse 94  Temp(Src) 98.4 F (36.9 C) (Oral)  Resp 15  Ht  (1.727 m)  Wt 300  lb (136.079 kg)  BMI 45.63 kg/m2  SpO2 97% Physical Exam  Constitutional: He is oriented to person, place, and time. He appears well-developed and well-nourished. No distress.  HENT:  Head: Normocephalic.  Eyes: Conjunctivae are normal. Pupils are equal, round, and reactive to light. No scleral icterus.  Neck: Normal range of motion. Neck supple. No thyromegaly present.  Cardiovascular: Normal rate and regular rhythm.  Exam reveals no gallop and no friction rub.   No  murmur heard. Pulmonary/Chest: Effort normal and breath sounds normal. No respiratory distress. He has no wheezes. He has no rales.  Tachypneic. Anxious. Mild prolongation. Globally diminished  Abdominal: Soft. Bowel sounds are normal. He exhibits no distension. There is no tenderness. There is no rebound.  Musculoskeletal: Normal range of motion.  Neurological: He is alert and oriented to person, place, and time.  Skin: Skin is warm and dry. No rash noted.  Psychiatric: He has a normal mood and affect. His behavior is normal.    ED Course  Procedures (including critical care time) Labs Review Labs Reviewed  COMPREHENSIVE METABOLIC PANEL - Abnormal; Notable for the following:    Glucose, Bld 121 (*)    ALT 79 (*)    Alkaline Phosphatase 146 (*)    All other components within normal limits  CBC - Abnormal; Notable for the following:    WBC 15.0 (*)    Platelets 410 (*)    All other components within normal limits  LIPASE, BLOOD  URINALYSIS, ROUTINE W REFLEX MICROSCOPIC (NOT [facility])  Rosezena SensorI-STAT TROPOININ, ED    Imaging Review Dg Chest 2 View  04/15/2016  CLINICAL DATA:  35 year old male with shortness of breath. EXAM: CHEST  2 VIEW COMPARISON:  Radiograph dated 03/02/2016 FINDINGS: The heart size and mediastinal contours are within normal limits. Both lungs are clear. The visualized skeletal structures are unremarkable. IMPRESSION: No active cardiopulmonary disease. Electronically Signed   By: Elgie CollardArash  Radparvar M.D.   On: 04/15/2016 23:21   I have personally reviewed and evaluated these images and lab results as part of my medical decision-making.   EKG Interpretation None      MDM   Final diagnoses:  None   Is anxious. Given Ativan. Given Solu-Medrol. Given nebulized albuterol. EKG was sinus tachycardia but no acute or ischemic changes. Non-hypoxemic. Oxygen is 100%. Normal x-ray.  On reexam states he is feeling better. Resting easily. Less coughing. Clear lungs. 100% sats.  Appropriate for discharge home. Tessalon, albuterol, prednisone. Here with acute changes.    Rolland PorterMark Yacqub Baston, MD 04/16/16 [phone]

## 2017-04-22 ENCOUNTER — Encounter (HOSPITAL_COMMUNITY): Payer: Self-pay | Admitting: Emergency Medicine

## 2017-04-22 ENCOUNTER — Emergency Department (HOSPITAL_COMMUNITY): Payer: Self-pay

## 2017-04-22 ENCOUNTER — Emergency Department (HOSPITAL_COMMUNITY)
Admission: EM | Admit: 2017-04-22 | Discharge: 2017-04-22 | Disposition: A | Discharge status: Home / Self Care | Payer: Self-pay | Attending: Emergency Medicine | Admitting: Emergency Medicine

## 2017-04-22 DIAGNOSIS — Z79899 Other long term (current) drug therapy: Secondary | ICD-10-CM | POA: Insufficient documentation

## 2017-04-22 DIAGNOSIS — S93491A Sprain of other ligament of right ankle, initial encounter: Secondary | ICD-10-CM | POA: Insufficient documentation

## 2017-04-22 DIAGNOSIS — Y9301 Activity, walking, marching and hiking: Secondary | ICD-10-CM | POA: Insufficient documentation

## 2017-04-22 DIAGNOSIS — F17210 Nicotine dependence, cigarettes, uncomplicated: Secondary | ICD-10-CM | POA: Insufficient documentation

## 2017-04-22 DIAGNOSIS — Y929 Unspecified place or not applicable: Secondary | ICD-10-CM | POA: Insufficient documentation

## 2017-04-22 DIAGNOSIS — I10 Essential (primary) hypertension: Secondary | ICD-10-CM | POA: Insufficient documentation

## 2017-04-22 DIAGNOSIS — Y999 Unspecified external cause status: Secondary | ICD-10-CM | POA: Insufficient documentation

## 2017-04-22 DIAGNOSIS — W1842XA Slipping, tripping and stumbling without falling due to stepping into hole or opening, initial encounter: Secondary | ICD-10-CM | POA: Insufficient documentation

## 2017-04-22 MED ORDER — CYCLOBENZAPRINE HCL 10 MG PO TABS: 10.0000 mg | ORAL_TABLET | Freq: Two times a day (BID) | ORAL | 0 refills | Status: DC | PRN

## 2017-04-22 MED ORDER — NAPROXEN 500 MG PO TABS: 500.0000 mg | ORAL_TABLET | Freq: Two times a day (BID) | ORAL | 0 refills | Status: DC

## 2017-04-22 NOTE — ED Notes (Signed)
Patient is A & o x4.  Understood AVS instructions.

## 2017-04-22 NOTE — ED Provider Notes (Signed)
[facility]-EMERGENCY DEPT Provider Note   CSN: [id number] Arrival date & time: 04/22/17  1912     History   Chief Complaint Chief Complaint  Patient presents with  . Foot Pain    HPI [name] is a 36 y.o. male.  HPI   36 year old obese male with history of hypertension presenting for evaluation of foot injury. Patient is Hispanic speaking, his fiance who is in the room is acting as a Nurse, learning disabilitytranslator. I did offer patient our language translator but he prefers fiance to translate.  Patient states earlier today he was walking, he accidentally stepped in an air vent and injured his R ankle/foot.  He report acute onset of sharp pain to affected area, increase with ambulation.  Pain is 6/10, non radiating, improves with rest.  He was able to ambulate.  No knee pain, no numbness. He did injured the same ankle 1 week ago but pain was minimal at that time.  No specific treatment tried.    Past Medical History:  Diagnosis Date  . Hypertension   . Obesity     There are no active problems to display for this patient.   Past Surgical History:  Procedure Laterality Date  . ANKLE SURGERY    . APPENDECTOMY    . KNEE SURGERY    . NOSE SURGERY         Home Medications    Prior to Admission medications   Medication Sig Start Date End Date Taking? Authorizing Provider  acetaminophen (TYLENOL) 500 MG tablet Take 1,000 mg by mouth every 6 (six) hours as needed (for headache).    [provider]  albuterol (PROVENTIL HFA;VENTOLIN HFA) 108 (90 Base) MCG/ACT inhaler Inhale 1-2 puffs into the lungs every 6 (six) hours as needed for wheezing. 04/16/16   Rolland PorterJames, Mark, MD  amoxicillin (AMOXIL) 500 MG capsule Take 1 capsule (500 mg total) by mouth 3 (three) times daily. Patient not taking: Reported on 04/15/2016 11/06/14   Harle Battiestysinger, Elizabeth, NP  antipyrine-benzocaine Lyla Son(AURALGAN) otic solution Place 3-4 drops into the left ear every 2 (two) hours as needed for ear pain. Patient not  taking: Reported on 04/15/2016 11/06/14   Harle Battiestysinger, Elizabeth, NP  benzonatate (TESSALON) 100 MG capsule Take 1 capsule (100 mg total) by mouth every 8 (eight) hours. 04/16/16   Rolland PorterJames, Mark, MD  ibuprofen (ADVIL,MOTRIN) 200 MG tablet Take 600 mg by mouth every 6 (six) hours as needed for headache.    [provider]  naproxen (NAPROSYN) 500 MG tablet Take 1 tablet (500 mg total) by mouth 2 (two) times daily with a meal. Patient not taking: Reported on 11/06/2014 11/07/13   Eber HongMiller, Brian, MD  naproxen (NAPROSYN) 500 MG tablet Take 1 tablet (500 mg total) by mouth 2 (two) times daily. Patient not taking: Reported on 04/15/2016 11/06/14   Harle Battiestysinger, Elizabeth, NP  oseltamivir (TAMIFLU) 75 MG capsule Take 1 capsule (75 mg total) by mouth every 12 (twelve) hours. Patient not taking: Reported on 11/06/2014 11/07/13   Eber HongMiller, Brian, MD  predniSONE (DELTASONE) 20 MG tablet Take 1 tablet (20 mg total) by mouth 2 (two) times daily with a meal. 04/16/16   Rolland PorterJames, Mark, MD    Family History No family history on file.  Social History Social History  Substance Use Topics  . Smoking status: Current Some Day Smoker    Packs/day: 0.10    Types: Cigarettes  . Smokeless tobacco: Never Used  . Alcohol use Yes     Comment: sometimes  Allergies   Patient has no known allergies.   Review of Systems Review of Systems  Constitutional: Negative for fever.  Musculoskeletal: Positive for arthralgias.  Skin: Negative for wound.  Neurological: Negative for numbness.     Physical Exam Updated Vital Signs BP 137/81 (BP Location: Left Arm)   Pulse (!) 114   Temp 99 F (37.2 C) (Oral)   Resp 18   Ht 6' (1.829 m)   Wt 136.1 kg (300 lb)   SpO2 100%   BMI 40.69 kg/m   Physical Exam  Constitutional: He appears well-developed and well-nourished. No distress.  HENT:  Head: Atraumatic.  Eyes: Conjunctivae are normal.  Neck: Neck supple.  Musculoskeletal: He exhibits tenderness (Right ankle:  Tenderness to lateral malleolus region with surrounding edema but no crepitus. Normal dorsiflexion and plantar flexion. Increased pain with ankle inversion and eversion. Mild tenderness noted to right midfoot. No deformity noted. ).  Right knee is nontender.  Neurological: He is alert.  Skin: No rash noted.  Psychiatric: He has a normal mood and affect.  Nursing note and vitals reviewed.    ED Treatments / Results  Labs (all labs ordered are listed, but only abnormal results are displayed) Labs Reviewed - No data to display  EKG  EKG Interpretation None       Radiology Dg Ankle Complete Right  Result Date: 04/22/2017 CLINICAL DATA:  Status post fall tonight with right ankle pain. EXAM: RIGHT ANKLE - COMPLETE 3+ VIEW COMPARISON:  None. FINDINGS: There is no evidence of fracture, dislocation, or joint effusion. There is no evidence of arthropathy or other focal bone abnormality. There is soft tissue swelling laterally. IMPRESSION: No acute fracture or dislocation.  Soft tissue swelling laterally. Electronically Signed   By: Sherian Rein M.D.   On: 04/22/2017 20:48   Dg Foot Complete Right  Result Date: 04/22/2017 CLINICAL DATA:  Status post fall today with right foot and ankle pain. EXAM: RIGHT FOOT COMPLETE - 3+ VIEW COMPARISON:  None. FINDINGS: There is no evidence of fracture or dislocation. There is no evidence of arthropathy or other focal bone abnormality. Soft tissues are unremarkable. IMPRESSION: Negative. Electronically Signed   By: Sherian Rein M.D.   On: 04/22/2017 20:49    Procedures Procedures (including critical care time)  Medications Ordered in ED Medications - No data to display   Initial Impression / Assessment and Plan / ED Course  I have reviewed the triage vital signs and the nursing notes.  Pertinent labs & imaging results that were available during my care of the patient were reviewed by me and considered in my medical decision making (see chart for  details).     BP (!) 153/97 (BP Location: Left Arm)   Pulse 97   Temp 98.1 F (36.7 C) (Oral)   Resp 18   Ht 6' (1.829 m)   Wt 136.1 kg (300 lb)   SpO2 97%   BMI 40.69 kg/m    Final Clinical Impressions(s) / ED Diagnoses   Final diagnoses:  Sprain of anterior talofibular ligament of right ankle, initial encounter    New Prescriptions Discharge Medication List as of 04/22/2017  8:55 PM    START taking these medications   Details  cyclobenzaprine (FLEXERIL) 10 MG tablet Take 1 tablet (10 mg total) by mouth 2 (two) times daily as needed for muscle spasms., Starting Thu 04/22/2017, Print       8:51 PM Patient here with right ankle injury from a mechanical accident. He  is able to ambulate. He does have swelling noted to the lateral malleoli lesion of his right ankle and tenderness to the proximal midfoot. Xray negative.  Will treat sxs. outpt f/u recommended.    Fayrene Helper, PA-C 04/23/17 [id number]    Lorre Nick, MD 04/26/17 [phone]

## 2017-04-22 NOTE — ED Triage Notes (Signed)
Patient stepped in a air conditioner hole and he fell.  He has swelling of right foot.  Patient states its painful to bear weight on foot. Denies hitting head, LOC, or blackouts.

## 2017-06-03 ENCOUNTER — Encounter (HOSPITAL_COMMUNITY): Payer: Self-pay

## 2017-06-03 ENCOUNTER — Emergency Department (HOSPITAL_COMMUNITY)
Admission: EM | Admit: 2017-06-03 | Discharge: 2017-06-03 | Disposition: A | Discharge status: Home / Self Care | Payer: Self-pay | Attending: Emergency Medicine | Admitting: Emergency Medicine

## 2017-06-03 DIAGNOSIS — Y929 Unspecified place or not applicable: Secondary | ICD-10-CM | POA: Insufficient documentation

## 2017-06-03 DIAGNOSIS — F17210 Nicotine dependence, cigarettes, uncomplicated: Secondary | ICD-10-CM | POA: Insufficient documentation

## 2017-06-03 DIAGNOSIS — H6121 Impacted cerumen, right ear: Secondary | ICD-10-CM | POA: Insufficient documentation

## 2017-06-03 DIAGNOSIS — Y999 Unspecified external cause status: Secondary | ICD-10-CM | POA: Insufficient documentation

## 2017-06-03 DIAGNOSIS — I10 Essential (primary) hypertension: Secondary | ICD-10-CM | POA: Insufficient documentation

## 2017-06-03 DIAGNOSIS — X58XXXA Exposure to other specified factors, initial encounter: Secondary | ICD-10-CM | POA: Insufficient documentation

## 2017-06-03 DIAGNOSIS — S0921XA Traumatic rupture of right ear drum, initial encounter: Secondary | ICD-10-CM | POA: Insufficient documentation

## 2017-06-03 DIAGNOSIS — Y93E8 Activity, other personal hygiene: Secondary | ICD-10-CM | POA: Insufficient documentation

## 2017-06-03 MED ORDER — OXYCODONE-ACETAMINOPHEN 5-325 MG PO TABS
1.0000 | ORAL_TABLET | Freq: Once | ORAL | Status: AC
  Administered 2017-06-03: 1 via ORAL
  Filled 2017-06-03: qty 1

## 2017-06-03 MED ORDER — OFLOXACIN 0.3 % OT SOLN: 5.0000 [drp] | Freq: Every day | OTIC | 0 refills | Status: DC

## 2017-06-03 MED ORDER — NAPROXEN 500 MG PO TABS: 500.0000 mg | ORAL_TABLET | Freq: Two times a day (BID) | ORAL | 0 refills | Status: DC

## 2017-06-03 MED ORDER — NAPROXEN 500 MG PO TABS
500.0000 mg | ORAL_TABLET | Freq: Once | ORAL | Status: AC
  Administered 2017-06-03: 500 mg via ORAL
  Filled 2017-06-03: qty 1

## 2017-06-03 NOTE — ED Triage Notes (Signed)
Pt complains of right ear pain Pt says he usually gets an infection in this ear and gets antibiotics

## 2017-06-03 NOTE — Discharge Instructions (Signed)
You were seen today for right ear pain. You have trauma to her right tympanic membrane. You may have a rupture. Take antibiotics as directed. Avoid submerging or getting water in the ear. Follow-up with ENT for repeat evaluation.

## 2017-06-03 NOTE — ED Notes (Signed)
Bed: [facility] Expected date:  Expected time:  Means of arrival:  Comments: 

## 2017-06-03 NOTE — ED Provider Notes (Signed)
[facility]-EMERGENCY DEPT Provider Note   CSN: [id number] Arrival date & time: 06/03/17  0327  By signing my name below, I, Diona BrownerJennifer Gorman, attest that this documentation has been prepared under the direction and in the presence of [name], Mayer Maskerourtney F, MD. Electronically Signed: Diona BrownerJennifer Gorman, ED Scribe. 06/03/17. 4:10 AM.  History   Chief Complaint Chief Complaint  Patient presents with  . Otalgia    HPI [name] is a 36 y.o. male who presents to the Emergency Department complaining of 8/10, right ear pain that started a couple of hours ago. Onset of symptoms was after using a Q-tip to clean out his ear. Associated sx include muffled hearing on the right side. He didn't take anything for pain. Pt notes his pain has improved since nurse flushed his ear out. He gets ear infections in this ear in the past. Pt denies fever or any other sx at this time.   The history is provided by the patient and the spouse. No language interpreter was used.    Past Medical History:  Diagnosis Date  . Hypertension   . Obesity     There are no active problems to display for this patient.   Past Surgical History:  Procedure Laterality Date  . ANKLE SURGERY    . APPENDECTOMY    . KNEE SURGERY    . NOSE SURGERY         Home Medications    Prior to Admission medications   Medication Sig Start Date End Date Taking? Authorizing Provider  acetaminophen (TYLENOL) 500 MG tablet Take 1,000 mg by mouth every 6 (six) hours as needed (for headache).    [provider]  albuterol (PROVENTIL HFA;VENTOLIN HFA) 108 (90 Base) MCG/ACT inhaler Inhale 1-2 puffs into the lungs every 6 (six) hours as needed for wheezing. 04/16/16   Rolland PorterJames, Mark, MD  amoxicillin (AMOXIL) 500 MG capsule Take 1 capsule (500 mg total) by mouth 3 (three) times daily. Patient not taking: Reported on 04/15/2016 11/06/14   Harle Battiestysinger, Elizabeth, NP  antipyrine-benzocaine Lyla Son(AURALGAN) otic solution Place 3-4 drops into the left  ear every 2 (two) hours as needed for ear pain. Patient not taking: Reported on 04/15/2016 11/06/14   Harle Battiestysinger, Elizabeth, NP  benzonatate (TESSALON) 100 MG capsule Take 1 capsule (100 mg total) by mouth every 8 (eight) hours. 04/16/16   Rolland PorterJames, Mark, MD  cyclobenzaprine (FLEXERIL) 10 MG tablet Take 1 tablet (10 mg total) by mouth 2 (two) times daily as needed for muscle spasms. 04/22/17   Fayrene Helperran, Bowie, PA-C  naproxen (NAPROSYN) 500 MG tablet Take 1 tablet (500 mg total) by mouth 2 (two) times daily with a meal. 04/22/17   Fayrene Helperran, Bowie, PA-C  naproxen (NAPROSYN) 500 MG tablet Take 1 tablet (500 mg total) by mouth 2 (two) times daily. 06/03/17   [name], Mayer Maskerourtney F, MD  ofloxacin (FLOXIN OTIC) 0.3 % OTIC solution Place 5 drops into the right ear daily. 06/03/17   [name], Mayer Maskerourtney F, MD  oseltamivir (TAMIFLU) 75 MG capsule Take 1 capsule (75 mg total) by mouth every 12 (twelve) hours. Patient not taking: Reported on 11/06/2014 11/07/13   Eber HongMiller, Brian, MD  predniSONE (DELTASONE) 20 MG tablet Take 1 tablet (20 mg total) by mouth 2 (two) times daily with a meal. 04/16/16   Rolland PorterJames, Mark, MD    Family History History reviewed. No pertinent family history.  Social History Social History  Substance Use Topics  . Smoking status: Current Some Day Smoker    Packs/day: 0.10  Types: Cigarettes  . Smokeless tobacco: Never Used  . Alcohol use Yes     Comment: sometimes     Allergies   Patient has no known allergies.   Review of Systems Review of Systems  Constitutional: Negative for fever.  HENT: Positive for ear pain. Negative for congestion and sore throat.   All other systems reviewed and are negative.    Physical Exam Updated Vital Signs BP (!) 164/126 (BP Location: Left Arm)   Pulse 93   Temp 97.9 F (36.6 C) (Oral)   Resp 18   SpO2 100%   Physical Exam  Constitutional: He is oriented to person, place, and time. He appears well-developed and well-nourished.  Obese  HENT:  Head:  Normocephalic and atraumatic.  Cerumen impaction right canal, this was cleared, subsequently noted to have diffusely erythematous TM with no light reflex, questionable perforation, no drainage noted  Cardiovascular: Normal rate and regular rhythm.   Pulmonary/Chest: Effort normal. No respiratory distress.  Musculoskeletal: He exhibits no edema.  Neurological: He is alert and oriented to person, place, and time.  Skin: Skin is warm and dry.  Psychiatric: He has a normal mood and affect.  Nursing note and vitals reviewed.    ED Treatments / Results  DIAGNOSTIC STUDIES: Oxygen Saturation is 100% on RA, normal by my interpretation.   COORDINATION OF CARE: 4:10 AM-Discussed next steps with pt which includes ear drops and following up with an ENT doctor. Pt verbalized understanding and is agreeable with the plan.   Labs (all labs ordered are listed, but only abnormal results are displayed) Labs Reviewed - No data to display  EKG  EKG Interpretation None       Radiology No results found.  Procedures Procedures (including critical care time)  Medications Ordered in ED Medications  oxyCODONE-acetaminophen (PERCOCET/ROXICET) 5-325 MG per tablet 1 tablet (not administered)  naproxen (NAPROSYN) tablet 500 mg (not administered)     Initial Impression / Assessment and Plan / ED Course  I have reviewed the triage vital signs and the nursing notes.  Pertinent labs & imaging results that were available during my care of the patient were reviewed by me and considered in my medical decision making (see chart for details).     Patient presents with ear pain after using a Q-tip when getting out of the shower. He appears to have trauma to the right tympanic membrane. Questionable rupture. Will treat as a rupture and have him follow-up with ENT. Ofloxacin drops prescribed and naproxen for pain. He was told to avoid submerging or getting his ear wet. Patient stated understanding.  After  history, exam, and medical workup I feel the patient has been appropriately medically screened and is safe for discharge home. Pertinent diagnoses were discussed with the patient. Patient was given return precautions.   Final Clinical Impressions(s) / ED Diagnoses   Final diagnoses:  Traumatic tympanic membrane perforation, right, initial encounter    New Prescriptions New Prescriptions   NAPROXEN (NAPROSYN) 500 MG TABLET    Take 1 tablet (500 mg total) by mouth 2 (two) times daily.   OFLOXACIN (FLOXIN OTIC) 0.3 % OTIC SOLUTION    Place 5 drops into the right ear daily.   I personally performed the services described in this documentation, which was scribed in my presence. The recorded information has been reviewed and is accurate.     Shon BatonHorton, Courtney F, MD 06/03/17 [phone]

## 2017-07-03 ENCOUNTER — Encounter (HOSPITAL_COMMUNITY): Payer: Self-pay

## 2017-07-03 ENCOUNTER — Emergency Department (HOSPITAL_COMMUNITY): Payer: Self-pay

## 2017-07-03 ENCOUNTER — Other Ambulatory Visit: Payer: Self-pay

## 2017-07-03 DIAGNOSIS — R0602 Shortness of breath: Secondary | ICD-10-CM | POA: Insufficient documentation

## 2017-07-03 DIAGNOSIS — Z5321 Procedure and treatment not carried out due to patient leaving prior to being seen by health care provider: Secondary | ICD-10-CM | POA: Insufficient documentation

## 2017-07-03 DIAGNOSIS — R079 Chest pain, unspecified: Secondary | ICD-10-CM | POA: Insufficient documentation

## 2017-07-03 LAB — BASIC METABOLIC PANEL
Anion gap: 11 (ref 5–15)
BUN: 13 mg/dL (ref 6–20)
CHLORIDE: 105 mmol/L (ref 101–111)
CO2: 22 mmol/L (ref 22–32)
CREATININE: 0.78 mg/dL (ref 0.61–1.24)
Calcium: 9.3 mg/dL (ref 8.9–10.3)
GFR calc Af Amer: >60 mL/min (ref >60)
GFR calc non Af Amer: >60 mL/min (ref >60)
GLUCOSE: 104 mg/dL — AB (ref 65–99)
Potassium: 3.8 mmol/L (ref 3.5–5.1)
SODIUM: 138 mmol/L (ref 135–145)

## 2017-07-03 LAB — POCT I-STAT TROPONIN I: Troponin i, poc: 0 ng/mL (ref 0.00–0.08)

## 2017-07-03 LAB — CBC
HEMATOCRIT: 39.9 % (ref 39.0–52.0)
Hemoglobin: 14.1 g/dL (ref 13.0–17.0)
MCH: 30.4 pg (ref 26.0–34.0)
MCHC: 35.3 g/dL (ref 30.0–36.0)
MCV: 86 fL (ref 78.0–100.0)
PLATELETS: 408 10*3/uL — AB (ref 150–400)
RBC: 4.64 MIL/uL (ref 4.22–5.81)
RDW: 12.7 % (ref 11.5–15.5)
WBC: 17.3 10*3/uL — AB (ref 4.0–10.5)

## 2017-07-03 NOTE — ED Triage Notes (Signed)
Pt presents with c/o chest pain and shortness of breath that started yesterday. Pt reports the pain is intermittent and not constant. Pt has a hx of hypertension. NAD at this time.

## 2017-07-04 ENCOUNTER — Emergency Department (HOSPITAL_COMMUNITY)
Admission: EM | Admit: 2017-07-04 | Discharge: 2017-07-04 | Disposition: A | Discharge status: Home / Self Care | Payer: Self-pay | Attending: Emergency Medicine | Admitting: Emergency Medicine

## 2017-07-04 NOTE — ED Notes (Signed)
Called to reassess vitals 

## 2017-07-04 NOTE — ED Notes (Signed)
Pt not noted to be in lobby several times when called for vitals recheck. 

## 2017-08-02 NOTE — ED Notes (Signed)
Called pt's name again to be triaged, but no response from the waiting room.

## 2017-08-02 NOTE — ED Notes (Signed)
Called name to be triaged, but no response from the waiting room.  

## 2017-08-03 ENCOUNTER — Emergency Department (HOSPITAL_COMMUNITY): Admission: EM | Admit: 2017-08-03 | Discharge: 2017-08-03 | Discharge status: Against Medical Advice | Payer: Self-pay

## 2017-08-03 NOTE — ED Notes (Signed)
Called pt a third time to be triaged, but no response from the waiting room.

## 2018-04-26 ENCOUNTER — Other Ambulatory Visit: Payer: Self-pay

## 2018-04-26 ENCOUNTER — Encounter (HOSPITAL_COMMUNITY): Payer: Self-pay | Admitting: Emergency Medicine

## 2018-04-26 ENCOUNTER — Emergency Department (HOSPITAL_COMMUNITY)
Admission: EM | Admit: 2018-04-26 | Discharge: 2018-04-26 | Disposition: A | Discharge status: Home / Self Care | Payer: Self-pay | Attending: Emergency Medicine | Admitting: Emergency Medicine

## 2018-04-26 ENCOUNTER — Emergency Department (HOSPITAL_COMMUNITY): Payer: Self-pay

## 2018-04-26 DIAGNOSIS — F17210 Nicotine dependence, cigarettes, uncomplicated: Secondary | ICD-10-CM | POA: Insufficient documentation

## 2018-04-26 DIAGNOSIS — I10 Essential (primary) hypertension: Secondary | ICD-10-CM | POA: Insufficient documentation

## 2018-04-26 DIAGNOSIS — Y999 Unspecified external cause status: Secondary | ICD-10-CM | POA: Insufficient documentation

## 2018-04-26 DIAGNOSIS — M25571 Pain in right ankle and joints of right foot: Secondary | ICD-10-CM | POA: Insufficient documentation

## 2018-04-26 DIAGNOSIS — Y929 Unspecified place or not applicable: Secondary | ICD-10-CM | POA: Insufficient documentation

## 2018-04-26 DIAGNOSIS — W109XXA Fall (on) (from) unspecified stairs and steps, initial encounter: Secondary | ICD-10-CM | POA: Insufficient documentation

## 2018-04-26 DIAGNOSIS — Z79899 Other long term (current) drug therapy: Secondary | ICD-10-CM | POA: Insufficient documentation

## 2018-04-26 DIAGNOSIS — Y9301 Activity, walking, marching and hiking: Secondary | ICD-10-CM | POA: Insufficient documentation

## 2018-04-26 NOTE — Discharge Instructions (Signed)
You were seen in the emergency department for ankle pain and swelling.  Your x-rays were normal.  I suspect symptoms are from a soft tissue injury or ligament injury.  Wear ankle brace for additional support for approximately 1 to 2 weeks and/or until the pain has improved.  Use your crutches for 1 week to prevent further microtrauma and inflammation.  Take 1000 mg of Tylenol every 6 hours for the next 3 to 5 days.  Ice.  Elevate.  Start doing range of motion exercises after 48 hours of rest.  Follow-up with your primary care doctor or orthopedist in 2 weeks if the pain and range of motion have not improved.  Return to the ER if you have coldness, purple discoloration to your fingertips, loss of sensation or strength to your ankle.

## 2018-04-26 NOTE — ED Triage Notes (Addendum)
Pt states that he was walking down the stairs yesterdaywhen his rt ankle rolled.  Lots of swelling and bruising noted.

## 2018-04-27 NOTE — ED Provider Notes (Signed)
[facility]-EMERGENCY DEPT Provider Note   CSN: [id number] Arrival date & time: 04/26/18  1926     History   Chief Complaint Chief Complaint  Patient presents with  . Ankle Pain    HPI Douglas Feil is a 37 y.o. male here for evaluation of right ankle pain. Onset 1 day ago after inversion injury. Pain is moderate to severe. Aggravated by weight bearing, palpation and AROM. No interventions or alleviating factors. Associated with swelling, bruising and tingling to the foot. No previous injuries or surgeries. He denies calf swelling or pain. Pt turned his ankle and went to work today, he stands up on his feet throughout work.   HPI  Past Medical History:  Diagnosis Date  . Hypertension   . Obesity     There are no active problems to display for this patient.   Past Surgical History:  Procedure Laterality Date  . ANKLE SURGERY    . APPENDECTOMY    . KNEE SURGERY    . NOSE SURGERY          Home Medications    Prior to Admission medications   Medication Sig Start Date End Date Taking? Authorizing Provider  acetaminophen (TYLENOL) 500 MG tablet Take 1,000 mg by mouth every 6 (six) hours as needed (for headache).    [provider]  albuterol (PROVENTIL HFA;VENTOLIN HFA) 108 (90 Base) MCG/ACT inhaler Inhale 1-2 puffs into the lungs every 6 (six) hours as needed for wheezing. 04/16/16   [name], MD  amoxicillin (AMOXIL) 500 MG capsule Take 1 capsule (500 mg total) by mouth 3 (three) times daily. Patient not taking: Reported on 04/15/2016 11/06/14   Harle Battiest, NP  antipyrine-benzocaine Lyla Son) otic solution Place 3-4 drops into the left ear every 2 (two) hours as needed for ear pain. Patient not taking: Reported on 04/15/2016 11/06/14   Harle Battiest, NP  benzonatate (TESSALON) 100 MG capsule Take 1 capsule (100 mg total) by mouth every 8 (eight) hours. 04/16/16   [name], MD  cyclobenzaprine (FLEXERIL) 10 MG tablet  Take 1 tablet (10 mg total) by mouth 2 (two) times daily as needed for muscle spasms. 04/22/17   Fayrene Helper, PA-C  naproxen (NAPROSYN) 500 MG tablet Take 1 tablet (500 mg total) by mouth 2 (two) times daily with a meal. 04/22/17   Fayrene Helper, PA-C  naproxen (NAPROSYN) 500 MG tablet Take 1 tablet (500 mg total) by mouth 2 (two) times daily. 06/03/17   [name], Mayer Masker, MD  ofloxacin (FLOXIN OTIC) 0.3 % OTIC solution Place 5 drops into the right ear daily. 06/03/17   [name], Mayer Masker, MD  oseltamivir (TAMIFLU) 75 MG capsule Take 1 capsule (75 mg total) by mouth every 12 (twelve) hours. Patient not taking: Reported on 11/06/2014 11/07/13   [name], MD  predniSONE (DELTASONE) 20 MG tablet Take 1 tablet (20 mg total) by mouth 2 (two) times daily with a meal. 04/16/16   [name], MD    Family History No family history on file.  Social History Social History   Tobacco Use  . Smoking status: Current Some Day Smoker    Packs/day: 0.10    Types: Cigarettes  . Smokeless tobacco: Never Used  Substance Use Topics  . Alcohol use: Yes    Comment: sometimes  . Drug use: No     Allergies   Patient has no known allergies.   Review of Systems Review of Systems  Musculoskeletal: Positive for arthralgias, gait problem  and joint swelling.  Skin: Positive for color change.  Neurological:       Tingling  All other systems reviewed and are negative.    Physical Exam Updated Vital Signs BP (!) 156/87 (BP Location: Right Arm)   Pulse 94   Temp 97.9 F (36.6 C) (Oral)   Resp 18   SpO2 100%   Physical Exam  Constitutional: He is oriented to person, place, and time. He appears well-developed and well-nourished.  Non-toxic appearance.  HENT:  Head: Normocephalic.  Right Ear: External ear normal.  Left Ear: External ear normal.  Nose: Nose normal.  Eyes: Conjunctivae and EOM are normal.  Neck: Full passive range of motion without pain.  Cardiovascular: Normal rate.  2+ DP and PT  pulses bilaterally. Toes warm and pink with good cap refill  Pulmonary/Chest: Effort normal. No tachypnea. No respiratory distress.  Musculoskeletal: Normal range of motion. He exhibits edema and tenderness.  Right ankle: moderate edema around ankle worse at lateral malleolus. Ecchymosis to area below lateral malleolus, lateral foot and base of toes.  Decreased active plantar flexion, dorsiflexion and inversion secondary to pain. PROM limited due to pain, however to crepitus with movement. Positive Talar tilt. Able to wiggle toes without pain. Achilles tendon is non tender. Negative anterior and posterior drawer. Negative Thompson test.   No asymmetric calf edema or tenderness  Neurological: He is alert and oriented to person, place, and time.  Sensation to light touch/pinch intact in bilateral feet 5/5 strength with plantar flexion and dorsiflexion bilaterally  Skin: Skin is warm and dry. Capillary refill takes less than 2 seconds.  Ecchymosis to right lateral malleolus, lateral foot and at base of toes.   Psychiatric: His behavior is normal. Thought content normal.     ED Treatments / Results  Labs (all labs ordered are listed, but only abnormal results are displayed) Labs Reviewed - No data to display  EKG None  Radiology Dg Ankle Complete Right  Result Date: 04/26/2018 CLINICAL DATA:  37 year old male rolled right ankle. Pain and bruising. Initial encounter. EXAM: RIGHT ANKLE - COMPLETE 3+ VIEW COMPARISON:  04/22/2017 plain film exam. FINDINGS: No fracture or dislocation. Prominent bimalleolar soft tissue swelling new from prior exam suggestive of result of soft tissue injury. Slight widening lateral aspect of ankle mortise similar to prior exam. Scattered degenerative changes similar to prior exam. IMPRESSION: No fracture or dislocation. Soft tissue swelling suggesting soft tissue injury. Electronically Signed   By: Lacy Duverney M.D.   On: 04/26/2018 20:44    Procedures Procedures  (including critical care time)  Medications Ordered in ED Medications - No data to display   Initial Impression / Assessment and Plan / ED Course  I have reviewed the triage vital signs and the nursing notes.  Pertinent labs & imaging results that were available during my care of the patient were reviewed by me and considered in my medical decision making (see chart for details).     37 yo M here with traumatic R ankle pain. He was able to go to work today after injury, symptoms have been gradually worsening.   On exam extremity is NVI. No asymmetric calf edema or tenderness. Decreased AROM secondary to pain.   X-rays reviewed by me and negative for acute bony injury.  Symptoms and exam most consistent with soft tissue/ligamentous injury.  Will dc with ankle brace, RICE, crutches, high dose NSAIDs and slow strengthening exercises. F/u with ortho prn for persistent pain.  Old records, if  available, reviewed by me. Imaging and labs viewed and interpreted by me and used in the medical decision making (formal interpretation from radiologist). Discharge home in stable condition, return precautions discussed. Patient, family if available, agreeable with plan for discharge home.  Final Clinical Impressions(s) / ED Diagnoses   Final diagnoses:  Acute right ankle pain    ED Discharge Orders    None       Liberty HandyGibbons, Carrie Usery J, PA-C 04/27/18 0113    Tegeler, Canary Brimhristopher J, MD 04/27/18 [phone]

## 2019-10-25 ENCOUNTER — Emergency Department (HOSPITAL_COMMUNITY): Payer: Self-pay

## 2019-10-25 ENCOUNTER — Emergency Department (HOSPITAL_COMMUNITY)
Admission: EM | Admit: 2019-10-25 | Discharge: 2019-10-25 | Disposition: A | Discharge status: Home / Self Care | Payer: Self-pay | Attending: Emergency Medicine | Admitting: Emergency Medicine

## 2019-10-25 ENCOUNTER — Encounter (HOSPITAL_COMMUNITY): Payer: Self-pay

## 2019-10-25 ENCOUNTER — Other Ambulatory Visit: Payer: Self-pay

## 2019-10-25 DIAGNOSIS — F17210 Nicotine dependence, cigarettes, uncomplicated: Secondary | ICD-10-CM | POA: Insufficient documentation

## 2019-10-25 DIAGNOSIS — L089 Local infection of the skin and subcutaneous tissue, unspecified: Secondary | ICD-10-CM

## 2019-10-25 DIAGNOSIS — Z791 Long term (current) use of non-steroidal anti-inflammatories (NSAID): Secondary | ICD-10-CM | POA: Insufficient documentation

## 2019-10-25 DIAGNOSIS — Z79899 Other long term (current) drug therapy: Secondary | ICD-10-CM | POA: Insufficient documentation

## 2019-10-25 DIAGNOSIS — L989 Disorder of the skin and subcutaneous tissue, unspecified: Secondary | ICD-10-CM | POA: Insufficient documentation

## 2019-10-25 DIAGNOSIS — I10 Essential (primary) hypertension: Secondary | ICD-10-CM | POA: Insufficient documentation

## 2019-10-25 LAB — CBC WITH DIFFERENTIAL/PLATELET
Abs Immature Granulocytes: 0.04 10*3/uL (ref 0.00–0.07)
Basophils Absolute: 0.1 10*3/uL (ref 0.0–0.1)
Basophils Relative: 1 %
Eosinophils Absolute: 0.2 10*3/uL (ref 0.0–0.5)
Eosinophils Relative: 2 %
HCT: 45.1 % (ref 39.0–52.0)
Hemoglobin: 14.9 g/dL (ref 13.0–17.0)
Immature Granulocytes: 0 %
Lymphocytes Relative: 26 %
Lymphs Abs: 3.4 10*3/uL (ref 0.7–4.0)
MCH: 29.9 pg (ref 26.0–34.0)
MCHC: 33 g/dL (ref 30.0–36.0)
MCV: 90.4 fL (ref 80.0–100.0)
Monocytes Absolute: 1 10*3/uL (ref 0.1–1.0)
Monocytes Relative: 8 %
Neutro Abs: 8.3 10*3/uL — ABNORMAL HIGH (ref 1.7–7.7)
Neutrophils Relative %: 63 %
Platelets: 410 10*3/uL — ABNORMAL HIGH (ref 150–400)
RBC: 4.99 MIL/uL (ref 4.22–5.81)
RDW: 12.4 % (ref 11.5–15.5)
WBC: 13 10*3/uL — ABNORMAL HIGH (ref 4.0–10.5)
nRBC: 0 % (ref 0.0–0.2)

## 2019-10-25 LAB — BASIC METABOLIC PANEL
Anion gap: 7 (ref 5–15)
BUN: 13 mg/dL (ref 6–20)
CO2: 28 mmol/L (ref 22–32)
Calcium: 9.1 mg/dL (ref 8.9–10.3)
Chloride: 106 mmol/L (ref 98–111)
Creatinine, Ser: 0.57 mg/dL — ABNORMAL LOW (ref 0.61–1.24)
GFR calc Af Amer: >60 mL/min (ref >60)
GFR calc non Af Amer: >60 mL/min (ref >60)
Glucose, Bld: 93 mg/dL (ref 70–99)
Potassium: 3.7 mmol/L (ref 3.5–5.1)
Sodium: 141 mmol/L (ref 135–145)

## 2019-10-25 MED ORDER — HYDROCODONE-ACETAMINOPHEN 5-325 MG PO TABS: 1.0000 | ORAL_TABLET | Freq: Four times a day (QID) | ORAL | 0 refills | Status: DC | PRN

## 2019-10-25 MED ORDER — SULFAMETHOXAZOLE-TRIMETHOPRIM 800-160 MG PO TABS: 1.0000 | ORAL_TABLET | Freq: Two times a day (BID) | ORAL | 0 refills | Status: DC

## 2019-10-25 MED ORDER — CEPHALEXIN 500 MG PO CAPS: 500.0000 mg | ORAL_CAPSULE | Freq: Four times a day (QID) | ORAL | 0 refills | Status: DC

## 2019-10-25 MED ORDER — SULFAMETHOXAZOLE-TRIMETHOPRIM 800-160 MG PO TABS: 1.0000 | ORAL_TABLET | Freq: Two times a day (BID) | ORAL | 0 refills | Status: AC

## 2019-10-25 NOTE — ED Provider Notes (Signed)
[name]   CSN: [id number] Arrival date & time: 10/25/19  1210     History Chief Complaint  Patient presents with  . Foot Pain    [name] is a 38 y.o. male.  HPI Patient presents to the emergency department with foot injury after stepping on a piece of glass 13 days ago.  The patient said he has a wound on the foot that is draining.  Patient states that putting pressure through the foot makes the pain worse.  The patient denies chest pain, shortness of breath, headache,blurred vision, neck pain, fever, cough, weakness, numbness, dizziness, anorexia, edema, abdominal pain, nausea, vomiting, diarrhea, rash, back pain, dysuria, hematemesis, bloody stool, near syncope, or syncope.    Past Medical History:  Diagnosis Date  . Hypertension   . Obesity     There are no problems to display for this patient.   Past Surgical History:  Procedure Laterality Date  . ANKLE SURGERY    . APPENDECTOMY    . KNEE SURGERY    . NOSE SURGERY         Family History  Problem Relation Age of Onset  . Diabetes Mother   . Hypertension Mother   . Healthy Father     Social History   Tobacco Use  . Smoking status: Current Some Day Smoker    Packs/day: 0.10    Types: Cigarettes  . Smokeless tobacco: Never Used  Substance Use Topics  . Alcohol use: Yes    Comment: sometimes  . Drug use: No    Home Medications Prior to Admission medications   Medication Sig Start Date End Date Taking? Authorizing Provider  acetaminophen (TYLENOL) 500 MG tablet Take 1,000 mg by mouth every 6 (six) hours as needed (for headache).    [provider]  albuterol (PROVENTIL HFA;VENTOLIN HFA) 108 (90 Base) MCG/ACT inhaler Inhale 1-2 puffs into the lungs every 6 (six) hours as needed for wheezing. 04/16/16   [name], MD  amoxicillin (AMOXIL) 500 MG capsule Take 1 capsule (500 mg total) by mouth 3 (three) times daily. Patient not  taking: Reported on 04/15/2016 11/06/14   [name], NP  antipyrine-benzocaine Toniann Fail) otic solution Place 3-4 drops into the left ear every 2 (two) hours as needed for ear pain. Patient not taking: Reported on 04/15/2016 11/06/14   [name], NP  benzonatate (TESSALON) 100 MG capsule Take 1 capsule (100 mg total) by mouth every 8 (eight) hours. 04/16/16   [name], MD  cyclobenzaprine (FLEXERIL) 10 MG tablet Take 1 tablet (10 mg total) by mouth 2 (two) times daily as needed for muscle spasms. 04/22/17   [name], PA-C  naproxen (NAPROSYN) 500 MG tablet Take 1 tablet (500 mg total) by mouth 2 (two) times daily with a meal. 04/22/17   [name], PA-C  naproxen (NAPROSYN) 500 MG tablet Take 1 tablet (500 mg total) by mouth 2 (two) times daily. 06/03/17   [name], [name], MD  ofloxacin (FLOXIN OTIC) 0.3 % OTIC solution Place 5 drops into the right ear daily. 06/03/17   [name], [name], MD  oseltamivir (TAMIFLU) 75 MG capsule Take 1 capsule (75 mg total) by mouth every 12 (twelve) hours. Patient not taking: Reported on 11/06/2014 11/07/13   Noemi Chapel, MD  predniSONE (DELTASONE) 20 MG tablet Take 1 tablet (20 mg total) by mouth 2 (two) times daily with a meal. 04/16/16   [name], MD    Allergies  Patient has no known allergies.  Review of Systems   Review of Systems All other systems negative except as documented in the HPI. All pertinent positives and negatives as reviewed in the HPI. Physical Exam Updated Vital Signs BP (!) 177/91   Pulse (!) 102   Temp 98.4 F (36.9 C) (Oral)   Resp 16   Ht 5\' 11"  (1.803 m)   Wt 122.5 kg   SpO2 100%   BMI 37.66 kg/m   Physical Exam Vitals and nursing note reviewed.  Constitutional:      General: He is not in acute distress.    Appearance: He is well-developed.  HENT:     Head: Normocephalic and atraumatic.  Eyes:     Pupils: Pupils are equal, round, and reactive to light.  Pulmonary:     Effort: Pulmonary  effort is normal.  Musculoskeletal:       Feet:  Skin:    General: Skin is warm and dry.  Neurological:     Mental Status: He is alert and oriented to person, place, and time.     ED Results / Procedures / Treatments   Labs (all labs ordered are listed, but only abnormal results are displayed) Labs Reviewed  BASIC METABOLIC PANEL - Abnormal; Notable for the following components:      Result Value   Creatinine, Ser 0.57 (*)    All other components within normal limits  CBC WITH DIFFERENTIAL/PLATELET - Abnormal; Notable for the following components:   WBC 13.0 (*)    Platelets 410 (*)    Neutro Abs 8.3 (*)    All other components within normal limits    EKG None  Radiology DG Foot Complete Right  Result Date: 10/25/2019 CLINICAL DATA:  Infection in the plantar aspect of the right foot after stepping on glass 13 days ago. EXAM: RIGHT FOOT COMPLETE - 3+ VIEW COMPARISON:  04/22/2017. FINDINGS: Mild 1st MTP joint degenerative spur formation. Mild spur formation at the articulation of the medial malleolus and talus. No fracture, radiopaque foreign body, soft tissue gas, bone destruction or periosteal reaction. IMPRESSION: 1. No fracture or radiopaque foreign body. 2. Mild 1st MTP joint and medial right ankle degenerative changes. Electronically Signed   By: 04/24/2017 M.D.   On: 10/25/2019 14:41    Procedures Procedures (including critical care time)  Medications Ordered in ED Medications - No data to display  ED Course  I have reviewed the triage vital signs and the nursing notes.  Pertinent labs & imaging results that were available during my care of the patient were reviewed by me and considered in my medical decision making (see chart for details).    MDM Rules/Calculators/A&P                      The patient has an infection in the right foot that is draining at this time.  I do not see a large abscess that needs to be opened further.  I will have the patient soak  the foot in warm water and also have the patient use crutches to ambulate.  At this point I do not see that the patient has an overwhelming infection that would cause him to need hospitalization.  But I do want the patient to recheck in 2 days to ensure that the area is improving.  Patient to be placed on p.o. antibiotics and advised to return for any worsening in his condition in the interim. Final Clinical Impression(s) /  ED Diagnoses Final diagnoses:  None    Rx / DC Orders ED Discharge Orders    None       Charlestine NightLawyer, Jorita Bohanon, PA-C 10/25/19 1512    Pricilla LovelessGoldston, Scott, MD 10/26/19 [phone]

## 2019-10-25 NOTE — Discharge Instructions (Addendum)
Return here in 2 days on Friday for a recheck.  Return here sooner if your condition significantly worsens.  I want you to soak your foot in warm water and elevate.  Keep the area clean and dry.  Regrese aqu en 2 das el viernes para una nueva revisin. Regrese aqu antes si su condicin empeora significativamente. Quiero que sumerjas tu pie en agua tibia y lo eleves. Mantenga el rea limpia y seca.

## 2019-10-25 NOTE — ED Provider Notes (Signed)
Pt was seen by Irena Cords, PA-C for foot pain.  Pt was prescribed Keflex, Norco and Bactrim to [facility] pharmacy however it did not cross through according to the pharmacy.  I have agreed to electronically filled the above medications.    [name], PA-C 10/25/19 1733    Dorie Rank, MD 10/26/19 [phone]

## 2019-10-25 NOTE — ED Triage Notes (Signed)
Patient states that he stepped on a piece of glass 13 days ago and now has an infection. Patient does have blood tinged purulent drainage from the bottom of the right foot.

## 2020-01-01 ENCOUNTER — Other Ambulatory Visit: Payer: Self-pay

## 2020-01-01 ENCOUNTER — Emergency Department (HOSPITAL_COMMUNITY): Payer: Self-pay

## 2020-01-01 ENCOUNTER — Encounter (HOSPITAL_COMMUNITY): Payer: Self-pay

## 2020-01-01 ENCOUNTER — Emergency Department (HOSPITAL_COMMUNITY)
Admission: EM | Admit: 2020-01-01 | Discharge: 2020-01-01 | Disposition: A | Discharge status: Home / Self Care | Payer: Self-pay | Attending: Emergency Medicine | Admitting: Emergency Medicine

## 2020-01-01 DIAGNOSIS — F17210 Nicotine dependence, cigarettes, uncomplicated: Secondary | ICD-10-CM | POA: Insufficient documentation

## 2020-01-01 DIAGNOSIS — Z79899 Other long term (current) drug therapy: Secondary | ICD-10-CM | POA: Insufficient documentation

## 2020-01-01 DIAGNOSIS — G8929 Other chronic pain: Secondary | ICD-10-CM | POA: Insufficient documentation

## 2020-01-01 DIAGNOSIS — I10 Essential (primary) hypertension: Secondary | ICD-10-CM | POA: Insufficient documentation

## 2020-01-01 DIAGNOSIS — M25462 Effusion, left knee: Secondary | ICD-10-CM | POA: Insufficient documentation

## 2020-01-01 MED ORDER — NAPROXEN 500 MG PO TABS: 500.0000 mg | ORAL_TABLET | Freq: Two times a day (BID) | ORAL | 0 refills | Status: DC

## 2020-01-01 NOTE — ED Provider Notes (Signed)
[name] Provider Note   CSN: [id number] Arrival date & time: 01/01/20  2010     History Chief Complaint  Patient presents with  . Knee Pain    [name] is a 39 y.o. male who presents with L knee pain and swelling. He has had pain for 3 days with associated swelling. He feels like the knee is unstable. He has had issues with chronic knee pain but has never had the swelling. He had surgery on his knee 12 years ago from a fall from a ladder but isn't sure exactly what surgery. He works as a Curator. He denies any acute trauma. He's been resting and elevating and swelling is improving but not going away. He has trouble extending and flexing the knee but is ambulatory.  HPI     Past Medical History:  Diagnosis Date  . Hypertension   . Obesity     There are no problems to display for this patient.   Past Surgical History:  Procedure Laterality Date  . ANKLE SURGERY    . APPENDECTOMY    . KNEE SURGERY    . NOSE SURGERY         Family History  Problem Relation Age of Onset  . Diabetes Mother   . Hypertension Mother   . Healthy Father     Social History   Tobacco Use  . Smoking status: Current Some Day Smoker    Packs/day: 0.10    Types: Cigarettes  . Smokeless tobacco: Never Used  Substance Use Topics  . Alcohol use: Yes    Comment: sometimes  . Drug use: No    Home Medications Prior to Admission medications   Medication Sig Start Date End Date Taking? Authorizing Provider  acetaminophen (TYLENOL) 500 MG tablet Take 1,000 mg by mouth every 6 (six) hours as needed (for headache).    [provider]  albuterol (PROVENTIL HFA;VENTOLIN HFA) 108 (90 Base) MCG/ACT inhaler Inhale 1-2 puffs into the lungs every 6 (six) hours as needed for wheezing. 04/16/16   [name], MD  amoxicillin (AMOXIL) 500 MG capsule Take 1 capsule (500 mg total) by mouth 3 (three) times daily. Patient not taking: Reported on  04/15/2016 11/06/14   [name], NP  antipyrine-benzocaine Toniann Fail) otic solution Place 3-4 drops into the left ear every 2 (two) hours as needed for ear pain. Patient not taking: Reported on 04/15/2016 11/06/14   [name], NP  benzonatate (TESSALON) 100 MG capsule Take 1 capsule (100 mg total) by mouth every 8 (eight) hours. 04/16/16   [name], MD  cephALEXin (KEFLEX) 500 MG capsule Take 1 capsule (500 mg total) by mouth 4 (four) times daily. 10/25/19   [name], PA-C  cyclobenzaprine (FLEXERIL) 10 MG tablet Take 1 tablet (10 mg total) by mouth 2 (two) times daily as needed for muscle spasms. 04/22/17   [name], PA-C  HYDROcodone-acetaminophen (NORCO/VICODIN) 5-325 MG tablet Take 1 tablet by mouth every 6 (six) hours as needed for moderate pain. 10/25/19   [name], PA-C  naproxen (NAPROSYN) 500 MG tablet Take 1 tablet (500 mg total) by mouth 2 (two) times daily with a meal. 04/22/17   [name], PA-C  naproxen (NAPROSYN) 500 MG tablet Take 1 tablet (500 mg total) by mouth 2 (two) times daily. 06/03/17   [name], [name], MD  ofloxacin (FLOXIN OTIC) 0.3 % OTIC solution Place 5 drops into the right ear daily. 06/03/17   [name], [name], MD  oseltamivir (TAMIFLU) 75 MG capsule Take 1 capsule (75 mg total) by mouth every 12 (twelve) hours. Patient not taking: Reported on 11/06/2014 11/07/13   [name], MD  predniSONE (DELTASONE) 20 MG tablet Take 1 tablet (20 mg total) by mouth 2 (two) times daily with a meal. 04/16/16   [name], MD    Allergies    Patient has no known allergies.  Review of Systems   Review of Systems  Constitutional: Negative for fever.  Musculoskeletal: Positive for arthralgias and joint swelling.    Physical Exam Updated Vital Signs BP (!) 144/126 (BP Location: Right Wrist)   Pulse 94   Temp 98 F (36.7 C) (Oral)   Resp 17   SpO2 97%   Physical Exam Vitals and nursing note reviewed.  Constitutional:      General: He is not  in acute distress.    Appearance: Normal appearance. He is well-developed. He is not ill-appearing.  HENT:     Head: Normocephalic and atraumatic.  Eyes:     General: No scleral icterus.       Right eye: No discharge.        Left eye: No discharge.     Conjunctiva/sclera: Conjunctivae normal.     Pupils: Pupils are equal, round, and reactive to light.  Cardiovascular:     Rate and Rhythm: Normal rate.  Pulmonary:     Effort: Pulmonary effort is normal. No respiratory distress.  Abdominal:     General: There is no distension.  Musculoskeletal:     Cervical back: Normal range of motion.     Comments: Left knee: There is diffuse knee swelling. There is diffuse tenderness of the knee. Decreased ROM. Unable to assess stability due to large effusion. Ambulatory  Skin:    General: Skin is warm and dry.  Neurological:     Mental Status: He is alert and oriented to person, place, and time.  Psychiatric:        Behavior: Behavior normal.     ED Results / Procedures / Treatments   Labs (all labs ordered are listed, but only abnormal results are displayed) Labs Reviewed - No data to display  EKG None  Radiology DG Knee Complete 4 Views Left  Result Date: 01/01/2020 CLINICAL DATA:  Left knee pain and swelling for 5 days EXAM: LEFT KNEE - COMPLETE 4+ VIEW COMPARISON:  01/06/2008 FINDINGS: Frontal, bilateral oblique, and lateral views of the left knee are obtained. There is a large joint effusion. There is mild patellofemoral compartment osteoarthritis. Prior healed patellar fracture. Remaining joint spaces are well preserved. No acute bony abnormality. IMPRESSION: 1. Large joint effusion. 2. 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 3. No acute bony abnormality. Electronically Signed   By: Sharlet Salina M.D.   On: 01/01/2020 21:53    Procedures Procedures (including critical care time)  Medications Ordered in ED Medications - No  data to display  ED Course  I have reviewed the triage vital signs and the nursing notes.  Pertinent labs & imaging results that were available during my care of the patient were reviewed by me and considered in my medical decision making (see chart for details).  39 year old male with atraumatic knee pain and swelling. On exam he has a significant joint effusion. He is able to ambulate. ROM is decreased. No redness or severe pain with ROM to suggest septic arthritis. Xray shows large joint effusion with arthritis. Will recommend RICE and NSAIDs. He  was given a knee sleeve and advised to f/u with ortho if symptoms persist.  MDM Rules/Calculators/A&P                       Final Clinical Impression(s) / ED Diagnoses Final diagnoses:  Knee effusion, left    Rx / DC Orders ED Discharge Orders    None       Bethel Born, PA-C 01/02/20 1810    Mancel Bale, MD 01/03/20 1558

## 2020-01-01 NOTE — Discharge Instructions (Signed)
Descanse: mantngase alejado de la rodilla izquierda tanto como sea posible Hielo: hielo durante 20 minutos a la vez, varias veces al da Compresin: use una abrazadera para brindar apoyo Elevar - elevar el tobillo por encima del nivel del corazn Ibuprofeno: tmelo con alimentos. Tomar hasta 3-4 veces al [employer].

## 2020-01-01 NOTE — ED Triage Notes (Signed)
Patient arrived stating 5 days ago he began having left knee pain. Declines any injury or falls. States he has been trying to rest and elevate but when he walks it begins to swell.

## 2020-01-01 NOTE — ED Notes (Signed)
Pt verbalizes understanding of DC instructions. Pt belongings returned and is ambulatory out of ED.  

## 2020-02-09 ENCOUNTER — Emergency Department (HOSPITAL_COMMUNITY)
Admission: EM | Admit: 2020-02-09 | Discharge: 2020-02-09 | Disposition: A | Discharge status: Home / Self Care | Payer: No Typology Code available for payment source | Attending: Emergency Medicine | Admitting: Emergency Medicine

## 2020-02-09 ENCOUNTER — Encounter (HOSPITAL_COMMUNITY): Payer: Self-pay

## 2020-02-09 ENCOUNTER — Emergency Department (HOSPITAL_COMMUNITY): Payer: No Typology Code available for payment source

## 2020-02-09 ENCOUNTER — Other Ambulatory Visit: Payer: Self-pay

## 2020-02-09 DIAGNOSIS — I10 Essential (primary) hypertension: Secondary | ICD-10-CM | POA: Insufficient documentation

## 2020-02-09 DIAGNOSIS — S0990XA Unspecified injury of head, initial encounter: Secondary | ICD-10-CM | POA: Diagnosis present

## 2020-02-09 DIAGNOSIS — F17210 Nicotine dependence, cigarettes, uncomplicated: Secondary | ICD-10-CM | POA: Diagnosis not present

## 2020-02-09 DIAGNOSIS — G44319 Acute post-traumatic headache, not intractable: Secondary | ICD-10-CM | POA: Diagnosis not present

## 2020-02-09 DIAGNOSIS — Y92411 Interstate highway as the place of occurrence of the external cause: Secondary | ICD-10-CM | POA: Diagnosis not present

## 2020-02-09 DIAGNOSIS — M545 Low back pain, unspecified: Secondary | ICD-10-CM

## 2020-02-09 DIAGNOSIS — Y999 Unspecified external cause status: Secondary | ICD-10-CM | POA: Diagnosis not present

## 2020-02-09 DIAGNOSIS — Y9389 Activity, other specified: Secondary | ICD-10-CM | POA: Diagnosis not present

## 2020-02-09 DIAGNOSIS — Z79899 Other long term (current) drug therapy: Secondary | ICD-10-CM | POA: Insufficient documentation

## 2020-02-09 DIAGNOSIS — M25571 Pain in right ankle and joints of right foot: Secondary | ICD-10-CM | POA: Diagnosis not present

## 2020-02-09 MED ORDER — METHOCARBAMOL 500 MG PO TABS: 500.0000 mg | ORAL_TABLET | Freq: Two times a day (BID) | ORAL | 0 refills | Status: AC

## 2020-02-09 MED ORDER — NAPROXEN 500 MG PO TABS: 500.0000 mg | ORAL_TABLET | Freq: Two times a day (BID) | ORAL | 0 refills | Status: AC

## 2020-02-09 MED ORDER — ACETAMINOPHEN 500 MG PO TABS
1000.0000 mg | ORAL_TABLET | Freq: Once | ORAL | Status: AC
  Administered 2020-02-09: 1000 mg via ORAL
  Filled 2020-02-09: qty 2

## 2020-02-09 NOTE — Discharge Instructions (Addendum)
The pain you are experiencing is likely due to muscle strain, you may take Naprosyn and Robaxin as needed for pain management. Do not combine with any pain reliever other than tylenol.  You may also use ice and heat, and over-the-counter remedies such as Biofreeze gel or salon pas lidocaine patches. The muscle soreness should improve over the next week. Follow up with your family doctor in the next week for a recheck if you are still having symptoms. Return to ED if pain is worsening, you develop weakness or numbness of extremities, or new or concerning symptoms develop. _________________________________________________________________________________________  Es probable que el dolor que experimente se deba a una distensin muscular; puede tomar Naprosyn y Robaxin segn sea necesario para Human resources officer. No lo combine con ningn otro analgsico que no sea tylenol. Tambin puede usar hielo y Airline pilot, y remedios de venta libre como el gel Biofreeze o los parches de Togo de saln de [place]. El dolor muscular debera mejorar durante la prxima semana. Haga un seguimiento con su mdico de familia durante la prxima semana para volver a verificar si an tiene sntomas. Regrese al servicio de urgencias si el dolor empeora, desarrolla debilidad o entumecimiento de las extremidades, o si se presentan sntomas nuevos o preocupantes.
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
## 2020-02-09 NOTE — ED Notes (Signed)
Pt just returned from x ray. Waiting for results
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
## 2020-02-10 ENCOUNTER — Other Ambulatory Visit: Payer: Self-pay

## 2020-02-10 ENCOUNTER — Ambulatory Visit
Admission: EM | Admit: 2020-02-10 | Discharge: 2020-02-10 | Disposition: A | Discharge status: Home / Self Care | Payer: Self-pay | Attending: Physician Assistant | Admitting: Physician Assistant

## 2020-02-10 DIAGNOSIS — M25571 Pain in right ankle and joints of right foot: Secondary | ICD-10-CM
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
## 2020-03-02 ENCOUNTER — Encounter (HOSPITAL_COMMUNITY): Payer: Self-pay

## 2020-03-02 ENCOUNTER — Emergency Department (HOSPITAL_COMMUNITY)
Admission: EM | Admit: 2020-03-02 | Discharge: 2020-03-09 | Disposition: E | Payer: Self-pay | Attending: Emergency Medicine | Admitting: Emergency Medicine

## 2020-03-02 DIAGNOSIS — S280XXA Crushed chest, initial encounter: Secondary | ICD-10-CM | POA: Insufficient documentation

## 2020-03-02 DIAGNOSIS — W208XXA Other cause of strike by thrown, projected or falling object, initial encounter: Secondary | ICD-10-CM | POA: Insufficient documentation

## 2020-03-02 DIAGNOSIS — Y939 Activity, unspecified: Secondary | ICD-10-CM | POA: Insufficient documentation

## 2020-03-02 DIAGNOSIS — Y999 Unspecified external cause status: Secondary | ICD-10-CM | POA: Insufficient documentation

## 2020-03-02 DIAGNOSIS — I468 Cardiac arrest due to other underlying condition: Secondary | ICD-10-CM | POA: Insufficient documentation

## 2020-03-02 DIAGNOSIS — Y929 Unspecified place or not applicable: Secondary | ICD-10-CM | POA: Insufficient documentation

## 2020-03-02 MED ORDER — EPINEPHRINE 1 MG/10ML IJ SOSY
PREFILLED_SYRINGE | INTRAMUSCULAR | Status: AC | PRN
  Administered 2020-03-02 (×6): 1 mg via INTRAVENOUS

## 2020-03-03 MED FILL — Medication: Qty: 1 | Status: AC
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
## 2020-03-09 NOTE — ED Notes (Signed)
Ultrasound performed, no pericardial activity, CPR continued

## 2020-03-09 NOTE — ED Notes (Addendum)
Pulse check, asystole, time of death 55
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
## 2020-03-09 NOTE — ED Notes (Signed)
Pulse check, no cardiac activity, compression resumed
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
## 2020-03-09 NOTE — ED Notes (Signed)
Pulse check, asystole, compressions resumed   
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
## 2020-03-09 DEATH — deceased
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
